# Patient Record
Sex: Female | Born: 1960
Health system: Southern US, Community
[De-identification: ages and names within clinical notes are randomized; demographics above are authoritative.]

## PROBLEM LIST (undated history)

## (undated) DIAGNOSIS — B029 Zoster without complications: Secondary | ICD-10-CM

## (undated) DIAGNOSIS — N390 Urinary tract infection, site not specified: Secondary | ICD-10-CM

## (undated) DIAGNOSIS — Z8619 Personal history of other infectious and parasitic diseases: Secondary | ICD-10-CM

## (undated) DIAGNOSIS — K635 Polyp of colon: Secondary | ICD-10-CM

## (undated) DIAGNOSIS — E079 Disorder of thyroid, unspecified: Secondary | ICD-10-CM

## (undated) DIAGNOSIS — B977 Papillomavirus as the cause of diseases classified elsewhere: Secondary | ICD-10-CM

## (undated) HISTORY — DX: Zoster without complications: B02.9

## (undated) HISTORY — DX: Disorder of thyroid, unspecified: E07.9

## (undated) HISTORY — PX: WISDOM TOOTH EXTRACTION: SHX21

## (undated) HISTORY — DX: Papillomavirus as the cause of diseases classified elsewhere: B97.7

## (undated) HISTORY — DX: Urinary tract infection, site not specified: N39.0

## (undated) HISTORY — PX: CHOLECYSTECTOMY: SHX55

## (undated) HISTORY — DX: Personal history of other infectious and parasitic diseases: Z86.19

## (undated) HISTORY — DX: Polyp of colon: K63.5

---

## 2012-10-31 LAB — HM COLONOSCOPY: HM Colonoscopy: ABNORMAL — AB

## 2013-01-01 LAB — HM COLONOSCOPY

## 2015-06-24 ENCOUNTER — Telehealth: Payer: Self-pay

## 2015-06-24 NOTE — Telephone Encounter (Signed)
Pre visit call completed 

## 2015-06-26 ENCOUNTER — Ambulatory Visit (INDEPENDENT_AMBULATORY_CARE_PROVIDER_SITE_OTHER): Payer: Self-pay | Admitting: Physician Assistant

## 2015-06-26 ENCOUNTER — Encounter: Payer: Self-pay | Admitting: Physician Assistant

## 2015-06-26 VITALS — BP 107/69 | HR 85 | Temp 98.2°F | Resp 16 | Ht 64.5 in | Wt 139.1 lb

## 2015-06-26 DIAGNOSIS — Z8619 Personal history of other infectious and parasitic diseases: Secondary | ICD-10-CM

## 2015-06-26 DIAGNOSIS — G629 Polyneuropathy, unspecified: Secondary | ICD-10-CM

## 2015-06-26 NOTE — Patient Instructions (Signed)
You will be contacted by Neurology for assessment. We will follow-up once you have seen the specialist.  Read options for smoking cessation below. Let me know what options you would like to try.  Smoking Cessation Quitting smoking is important to your health and has many advantages. However, it is not always easy to quit since nicotine is a very addictive drug. Oftentimes, people try 3 times or more before being able to quit. This document explains the best ways for you to prepare to quit smoking. Quitting takes hard work and a lot of effort, but you can do it. ADVANTAGES OF QUITTING SMOKING  You will live longer, feel better, and live better.  Your body will feel the impact of quitting smoking almost immediately.  Within 20 minutes, blood pressure decreases. Your pulse returns to its normal level.  After 8 hours, carbon monoxide levels in the blood return to normal. Your oxygen level increases.  After 24 hours, the chance of having a heart attack starts to decrease. Your breath, hair, and body stop smelling like smoke.  After 48 hours, damaged nerve endings begin to recover. Your sense of taste and smell improve.  After 72 hours, the body is virtually free of nicotine. Your bronchial tubes relax and breathing becomes easier.  After 2 to 12 weeks, lungs can hold more air. Exercise becomes easier and circulation improves.  The risk of having a heart attack, stroke, cancer, or lung disease is greatly reduced.  After 1 year, the risk of coronary heart disease is cut in half.  After 5 years, the risk of stroke falls to the same as a nonsmoker.  After 10 years, the risk of lung cancer is cut in half and the risk of other cancers decreases significantly.  After 15 years, the risk of coronary heart disease drops, usually to the level of a nonsmoker.  If you are pregnant, quitting smoking will improve your chances of having a healthy baby.  The people you live with, especially any  children, will be healthier.  You will have extra money to spend on things other than cigarettes. QUESTIONS TO THINK ABOUT BEFORE ATTEMPTING TO QUIT You may want to talk about your answers with your health care provider.  Why do you want to quit?  If you tried to quit in the past, what helped and what did not?  What will be the most difficult situations for you after you quit? How will you plan to handle them?  Who can help you through the tough times? Your family? Friends? A health care provider?  What pleasures do you get from smoking? What ways can you still get pleasure if you quit? Here are some questions to ask your health care provider:  How can you help me to be successful at quitting?  What medicine do you think would be best for me and how should I take it?  What should I do if I need more help?  What is smoking withdrawal like? How can I get information on withdrawal? GET READY  Set a quit date.  Change your environment by getting rid of all cigarettes, ashtrays, matches, and lighters in your home, car, or work. Do not let people smoke in your home.  Review your past attempts to quit. Think about what worked and what did not. GET SUPPORT AND ENCOURAGEMENT You have a better chance of being successful if you have help. You can get support in many ways.  Tell your family, friends, and coworkers that  you are going to quit and need their support. Ask them not to smoke around you.  Get individual, group, or telephone counseling and support. Programs are available at [employer] and health centers. Call your local health department for information about programs in your area.  Spiritual beliefs and practices may help some smokers quit.  Download a "quit meter" on your computer to keep track of quit statistics, such as how long you have gone without smoking, cigarettes not smoked, and money saved.  Get a self-help book about quitting smoking and staying off  tobacco. LEARN NEW SKILLS AND BEHAVIORS  Distract yourself from urges to smoke. Talk to someone, go for a walk, or occupy your time with a task.  Change your normal routine. Take a different route to work. Drink tea instead of coffee. Eat breakfast in a different place.  Reduce your stress. Take a hot bath, exercise, or read a book.  Plan something enjoyable to do every day. Reward yourself for not smoking.  Explore interactive web-based programs that specialize in helping you quit. GET MEDICINE AND USE IT CORRECTLY Medicines can help you stop smoking and decrease the urge to smoke. Combining medicine with the above behavioral methods and support can greatly increase your chances of successfully quitting smoking.  Nicotine replacement therapy helps deliver nicotine to your body without the negative effects and risks of smoking. Nicotine replacement therapy includes nicotine gum, lozenges, inhalers, nasal sprays, and skin patches. Some may be available over-the-counter and others require a prescription.  Antidepressant medicine helps people abstain from smoking, but how this works is unknown. This medicine is available by prescription.  Nicotinic receptor partial agonist medicine simulates the effect of nicotine in your brain. This medicine is available by prescription. Ask your health care provider for advice about which medicines to use and how to use them based on your health history. Your health care provider will tell you what side effects to look out for if you choose to be on a medicine or therapy. Carefully read the information on the package. Do not use any other product containing nicotine while using a nicotine replacement product.  RELAPSE OR DIFFICULT SITUATIONS Most relapses occur within the first 3 months after quitting. Do not be discouraged if you start smoking again. Remember, most people try several times before finally quitting. You may have symptoms of withdrawal because  your body is used to nicotine. You may crave cigarettes, be irritable, feel very hungry, cough often, get headaches, or have difficulty concentrating. The withdrawal symptoms are only temporary. They are strongest when you first quit, but they will go away within 10-14 days. To reduce the chances of relapse, try to:  Avoid drinking alcohol. Drinking lowers your chances of successfully quitting.  Reduce the amount of caffeine you consume. Once you quit smoking, the amount of caffeine in your body increases and can give you symptoms, such as a rapid heartbeat, sweating, and anxiety.  Avoid smokers because they can make you want to smoke.  Do not let weight gain distract you. Many smokers will gain weight when they quit, usually less than 10 pounds. Eat a healthy diet and stay active. You can always lose the weight gained after you quit.  Find ways to improve your mood other than smoking. FOR MORE INFORMATION  www.smokefree.gov  Document Released: 10/11/2001 Document Revised: 03/03/2014 Document Reviewed: 01/26/2012 [facility] Patient Information 2015 [place], [place]. This information is not intended to replace advice given to you by your health care provider.  Make sure you discuss any questions you have with your health care provider.  

## 2015-06-26 NOTE — Progress Notes (Signed)
Patient presents to clinic today to establish care.  Acute Concerns: Patient with history of herniated discs s/p surgical intervention. Endorses persistent idiopathic neuropathy since that time. Was seen by Neurology in [place] before just moving here. Is requesting referral to Neurology in the area.  Chronic Issues: Patient with recurrent shingles outbreaks, last about 3 months ago. Endorses prior immunocompetency testing without abnormal/worrisome findings. Is asking about potentially having the shingles vaccination.  Past Medical History  Diagnosis Date  . Thyroid disease     Nodules 2014, benign  . History of chicken pox   . Shingles   . Colon polyps     Benign  . UTI (lower urinary tract infection)     Past Surgical History  Procedure Laterality Date  . Cholecystectomy      2012  . Cesarean section      1996  . Wisdom tooth extraction      Current Outpatient Prescriptions on File Prior to Visit  Medication Sig Dispense Refill  . cyclobenzaprine (FLEXERIL) 10 MG tablet Take 10 mg by mouth 3 (three) times daily as needed for muscle spasms.    . naproxen (NAPROSYN) 250 MG tablet Take by mouth 2 (two) times daily as needed.      No current facility-administered medications on file prior to visit.    No Known Allergies  Family History  Problem Relation Age of Onset  . Lung cancer Mother 26    Deceased  . Prostate cancer Father 66    Deceased  . Pancreatic cancer Paternal Uncle   . Healthy Brother     x3  . Healthy Sister     x2  . Menstrual problems Daughter     x1    Social History   Social History  . Marital Status: Married    Spouse Name: N/A  . Number of Children: 1  . Years of Education: N/A   Occupational History  . Not on file.   Social History Main Topics  . Smoking status: Current Every Day Smoker -- 0.50 packs/day for 25 years  . Smokeless tobacco: Never Used  . Alcohol Use: 1.2 - 1.8 oz/week    2-3 Standard drinks or equivalent per  week     Comment: social  . Drug Use: No  . Sexual Activity: Not Currently   Other Topics Concern  . Not on file   Social History Narrative   Review of Systems  Constitutional: Negative for fever, weight loss and malaise/fatigue.  Neurological: Positive for tingling and sensory change. Negative for dizziness and loss of consciousness.    BP 107/69 mmHg  Pulse 85  Temp(Src) 98.2 F (36.8 C) (Oral)  Resp 16  Ht 5' 4.5" (1.638 m)  Wt 139 lb 2 oz (63.107 kg)  BMI 23.52 kg/m2  SpO2 100%  Physical Exam  Constitutional: She is oriented to person, place, and time and well-developed, well-nourished, and in no distress.  HENT:  Head: Normocephalic and atraumatic.  Cardiovascular: Normal rate, regular rhythm, normal heart sounds and intact distal pulses.   Pulmonary/Chest: Effort normal and breath sounds normal. No respiratory distress. She has no wheezes. She has no rales. She exhibits no tenderness.  Neurological: She is alert and oriented to person, place, and time.  Skin: Skin is warm and dry. No rash noted.  Psychiatric: Affect normal.  Vitals reviewed.   No results found for this or any previous visit (from the past 2160 hour(s)).  Assessment/Plan: Neuropathy Unspecified. Patient not wanting further  PCP workup. Is requesting referral to Neurology. Referral placed. Patient is current smoker. Advised her that smoking intensifies neuropathy symptoms. Encouraged to quit. Patient given handout on cessation options besides Chantix as she has had bad reaction previously. Will call after reviewing so treatment can be started.  History of shingles Discussed vaccination is not a guarantee there will be no more outbreaks. Will need to be outbreak free x 6 months before Zostavax administration. Also recommend ID referral.

## 2015-06-28 DIAGNOSIS — Z8619 Personal history of other infectious and parasitic diseases: Secondary | ICD-10-CM | POA: Insufficient documentation

## 2015-06-28 DIAGNOSIS — G629 Polyneuropathy, unspecified: Secondary | ICD-10-CM | POA: Insufficient documentation

## 2015-06-28 NOTE — Assessment & Plan Note (Signed)
Discussed vaccination is not a guarantee there will be no more outbreaks. Will need to be outbreak free x 6 months before Zostavax administration. Also recommend ID referral.

## 2015-06-28 NOTE — Assessment & Plan Note (Signed)
Unspecified. Patient not wanting further PCP workup. Is requesting referral to Neurology. Referral placed. Patient is current smoker. Advised her that smoking intensifies neuropathy symptoms. Encouraged to quit. Patient given handout on cessation options besides Chantix as she has had bad reaction previously. Will call after reviewing so treatment can be started.

## 2015-07-22 ENCOUNTER — Encounter: Payer: Self-pay | Admitting: Neurology

## 2015-07-22 ENCOUNTER — Ambulatory Visit (INDEPENDENT_AMBULATORY_CARE_PROVIDER_SITE_OTHER): Payer: Self-pay | Admitting: Neurology

## 2015-07-22 VITALS — BP 130/84 | HR 113 | Ht 64.5 in | Wt 138.6 lb

## 2015-07-22 DIAGNOSIS — M792 Neuralgia and neuritis, unspecified: Secondary | ICD-10-CM

## 2015-07-22 DIAGNOSIS — G379 Demyelinating disease of central nervous system, unspecified: Secondary | ICD-10-CM

## 2015-07-22 DIAGNOSIS — G373 Acute transverse myelitis in demyelinating disease of central nervous system: Secondary | ICD-10-CM

## 2015-07-22 DIAGNOSIS — M62838 Other muscle spasm: Secondary | ICD-10-CM

## 2015-07-22 DIAGNOSIS — G0489 Other myelitis: Secondary | ICD-10-CM

## 2015-07-22 DIAGNOSIS — M6249 Contracture of muscle, multiple sites: Secondary | ICD-10-CM

## 2015-07-22 MED ORDER — GABAPENTIN 300 MG PO CAPS: 300.0000 mg | ORAL_CAPSULE | Freq: Every day | ORAL | Status: DC

## 2015-07-22 MED ORDER — BACLOFEN 10 MG PO TABS: 5.0000 mg | ORAL_TABLET | Freq: Two times a day (BID) | ORAL | Status: DC

## 2015-07-22 NOTE — Progress Notes (Signed)
[facility] Neu[facility] Clinic Note - Initial Visit   Date: 07/23/2015  Regina Griffin MRN: [id number] DOB: 16-May-1961   Dear Malva Cogan:  Thank you for your kind referral of Regina Griffin for consultation of thoracic myelopathy. Although her history is well known to you, please allow [place] to reiterate it for the purpose of our medical record. The patient was accompanied to the clinic by self.    History of Present Illness: Regina Griffin is a 54 y.o. right-handed Caucasian female with chronic back pain and tobacco user presenting for evaluation of thoracic cord lesion with myelopathy.    She has been living in [place], [place] since December 2014 and moved to [place], [place] in July 2016.  She is here to establish care locally.  Starting in February 2015, she starting having bilateral leg cramps and thought it was due her low back.  In Janaury 2016, she developed tingling and numbness of the left foot, which again she attributed to her low back.  Over two weeks, tingling started involving her thigh, abdomen, and up to her rib cage.  She did accupuncture which helped her weakness and pain, but did not change her tingling.  In March, she woke up with right foot numbness and over two days, progressed to involve her entire right side to her ribs. Eventually, she was unable to even stand due to weakness and she lost function of her right leg.  She was admitted to hospital where imaging of her thoracic spine who showed new upper and mid thoracic T2 hyperintensity.  MRI brain was normal. CSF testing showed positive oligoclonal bands.  NMO antibody was negative.   She completed methlyprednisolone 1g for 5 days followed by oral prednisone taper which improved her motor function where she was discharged with a walker.   She was using a walker until April.  She had continued hypersensitivity of the legs.    She was being followed by Dr. Kelli Hope, neurologist in [place], [place] and  last evaluated in May 2016 at which time it was discussed whether this episode was an isolated event or the first presentation of multiple sclerosis and whether to start disease modifying therapy.  Because she was clinically improving, the decision for expectant approach was made and she was told to establish care once she moved to [facility].  Currently, she continues to have tingling and hypersensitivity over rib cage down to her feet.  She feels that her right leg strength has completely returned and denies any new symptoms.  She has occasional cramps of the legs.  She has not had any recent falls and is ambulating independently.   No prior history of vision loss or weakness. She reports having right hand paresthesias in 2000s, which was attributed to her neck disc herniation.   She has a sister with optic neuritis and is wheelchair bound because of disc herniation.     Out-side paper records, electronic medical record, and images have been reviewed where available and summarized as:  MRI lumbar spine 12/12/2014: 1.  No etiology for left L1 nerve root symptoms.  Small discs in the thoracic spine at T8-T9 and T10-T11. 2.  There is multilevel degenerative disc disease in the lower lumbar spine.  On the left side the worse level is L5-S1 with narrowing of the left lateral recess and contact on the descending left S1 nerve root. 3.  On the right side, there is stenosis of the right lateral recess at L4-L5 with probable impingement of the  descending right L5 nerve root. 4.  Mild canal stenosis at L3-L4 and L4-L5.  MRI brain 01/22/2015:  Normal study.  No evidence of demyelination.   MRI cervical spine 01/22/2015:   Multilevel degenerative spondylosis of the cervical spine.  Posterior disc bulges at all levels, most marked at C5-6 and C6-7 where there is tiny focus of cord signal change at C5-6.  Multilevel neural foraminal narrowing.    Labs 01/22/2015:  CRP 1.1, TSH 0.56, CK 187  Past Medical History    Diagnosis Date  . Thyroid disease     Nodules 2014, benign  . History of chicken pox   . Shingles   . Colon polyps     Benign  . UTI (lower urinary tract infection)     Past Surgical History  Procedure Laterality Date  . Cholecystectomy      2012  . Cesarean section      1996  . Wisdom tooth extraction       Medications:  Outpatient Encounter Prescriptions as of 07/22/2015  Medication Sig  . Cholecalciferol (VITAMIN D3) 5000 UNITS CAPS Take 5,000 Units by mouth daily.  . cyclobenzaprine (FLEXERIL) 10 MG tablet Take 10 mg by mouth 3 (three) times daily as needed for muscle spasms.  Marland Kitchen HYDROcodone-acetaminophen (NORCO) 7.5-325 MG per tablet Take 1 tablet by mouth every 6 (six) hours as needed for moderate pain.  . Multiple Vitamin (MULTIVITAMIN) tablet Take 1 tablet by mouth daily.  . naproxen (NAPROSYN) 250 MG tablet Take by mouth 2 (two) times daily as needed.   . baclofen (LIORESAL) 10 MG tablet Take 0.5 tablets (5 mg total) by mouth 2 (two) times daily.  Marland Kitchen gabapentin (NEURONTIN) 300 MG capsule Take 1 capsule (300 mg total) by mouth at bedtime.   No facility-administered encounter medications on file as of 07/22/2015.     Allergies: No Known Allergies  Family History: Family History  Problem Relation Age of Onset  . Lung cancer Mother 32    Deceased  . Prostate cancer Father 66    Deceased  . Pancreatic cancer Paternal Uncle   . Healthy Brother     x3  . Healthy Sister     x2  . Menstrual problems Daughter     x1    Social History: Social History  Substance Use Topics  . Smoking status: Current Every Day Smoker -- 0.50 packs/day for 25 years  . Smokeless tobacco: Never Used  . Alcohol Use: 1.2 - 1.8 oz/week    2-3 Standard drinks or equivalent per week     Comment: social   Social History   Social History Narrative   Lives with husband in a 2 story home.  Has one daughter. Lives in [place] but is moving back to the [place]  Self employed.  Education:  associates degree.    Review of Systems:  CONSTITUTIONAL: No fevers, chills, night sweats, or weight loss.   EYES: No visual changes or eye pain ENT: No hearing changes.  No history of nose bleeds.   RESPIRATORY: No cough, wheezing and shortness of breath.   CARDIOVASCULAR: Negative for chest pain, and palpitations.   GI: Negative for abdominal discomfort, blood in stools or black stools.  No recent change in bowel habits.   GU:  No history of incontinence.   MUSCLOSKELETAL: No history of joint pain or swelling.  No myalgias.   SKIN: Negative for lesions, rash, and itching.   HEMATOLOGY/ONCOLOGY: Negative for prolonged bleeding, bruising easily, and swollen nodes.  No history of cancer.   ENDOCRINE: Negative for cold or heat intolerance, polydipsia or goiter.   PSYCH:  No depression or anxiety symptoms.   NEURO: As Above.   Vital Signs:  BP 130/84 mmHg  Pulse 113  Ht 5' 4.5" (1.638 m)  Wt 138 lb 9 oz (62.852 kg)  BMI 23.43 kg/m2  SpO2 99%   General Medical Exam:   General:  Well appearing, comfortable.   Eyes/ENT: see cranial nerve examination.   Neck: No masses appreciated.  Full range of motion without tenderness.  No carotid bruits. Respiratory:  Clear to auscultation, good air entry bilaterally.   Cardiac:  Regular rate and rhythm, no murmur.   Extremities:  No deformities, edema, or skin discoloration.  Skin:  No rashes or lesions.  Neurological Exam: MENTAL STATUS including orientation to time, place, person, recent and remote memory, attention span and concentration, language, and fund of knowledge is normal.  Speech is not dysarthric.  CRANIAL NERVES: II:  No visual field defects.  Unremarkable fundi.   III-IV-VI: Pupils equal round and reactive to light.  Normal conjugate, extra-ocular eye movements in all directions of gaze.  No nystagmus.  No ptosis.   V:  Normal facial sensation.     VII:  Normal facial symmetry and movements.  No pathologic facial reflexes.    VIII:  Normal hearing and vestibular function.   IX-X:  Normal palatal movement.   XI:  Normal shoulder shrug and head rotation.   XII:  Normal tongue strength and range of motion, no deviation or fasciculation.  MOTOR:  No atrophy, fasciculations or abnormal movements.  No pronator drift.  Tone is normal.    Right Upper Extremity:    Left Upper Extremity:    Deltoid  5/5   Deltoid  5/5   Biceps  5/5   Biceps  5/5   Triceps  5/5   Triceps  5/5   Wrist extensors  5/5   Wrist extensors  5/5   Wrist flexors  5/5   Wrist flexors  5/5   Finger extensors  5/5   Finger extensors  5/5   Finger flexors  5/5   Finger flexors  5/5   Dorsal interossei  5/5   Dorsal interossei  5/5   Abductor pollicis  5/5   Abductor pollicis  5/5   Tone (Ashworth scale)  0  Tone (Ashworth scale)  0   Right Lower Extremity:    Left Lower Extremity:    Hip flexors  5/5   Hip flexors  5/5   Hip extensors  5/5   Hip extensors  5/5   Knee flexors  5/5   Knee flexors  5/5   Knee extensors  5/5   Knee extensors  5/5   Dorsiflexors  5/5   Dorsiflexors  5/5   Plantarflexors  5/5   Plantarflexors  5/5   Toe extensors  5/5   Toe extensors  5/5   Toe flexors  5/5   Toe flexors  5/5   Tone (Ashworth scale)  1  Tone (Ashworth scale)  1   MSRs:  Right  Left brachioradialis 2+  brachioradialis 2+  biceps 2+  biceps 2+  triceps 2+  triceps 2+  patellar 3+  patellar 3+  ankle jerk 2+  ankle jerk 2+  Hoffman no  Hoffman no  plantar response down  plantar response up   SENSORY:  Hyperesthesias to pin prick and temperature with sensory level at T9 bilaterally.  Vibration intact.  Romberg's sign absent.   COORDINATION/GAIT: Normal finger-to- nose-finger and heel-to-shin.  Intact rapid alternating movements bilaterally.  Able to rise from a chair without using arms.  Gait narrow based and stable, subtle spastic quality. Tandem and stressed gait intact.     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  Her symptoms have vastly improved after receiving a course of methylprednisolone and she no longer has leg weakness, but continues to have hyperesthesia involving her mid back down.  I have personally reviewed her images which shows short segment of T2 hyperintensity over ~ T3 and T8 levels.    I agree with her previous neurologist that this most likely represents clinically isolated syndrome and does not warrant disease modifying therapy at this time.  If she were to develop any new lesions on her MRI or clinical symptoms, low threshold to start immunomodulatory therapy.  I will obtain surveillance imaging, but we can do this once her insurance has started here, unless there is a change in her clinical presentation.  PLAN/RECOMMENDATIONS:  1.  MRI brain, cervical spine, and thoracic spine wwo contrast to be performed once patient has insurance.  She will call and let us know once her insurance paperwork has been approved 2.  Start gabapentin  at bedtime for paresthesias 3.  Start baclofen  twice daily for muscle spasticity  4.  Start vitamin D 4000 units daily  Return to clinic in 3 months, or sooner as needed   The duration of this appointment visit was 60 minutes of face-to-face time with the patient.  Greater than 50% of this time was spent in counseling, explanation of diagnosis, planning of further management, and coordination of care.   Thank you for allowing me to participate in patient's care.  If I can answer any additional questions, I would be pleased to do so.    Sincerely,    [name], DO

## 2015-07-22 NOTE — Patient Instructions (Signed)
1.  Please find out about your insurance coverage so we can order imaging as needed 2.  Start gabapentin 300mg  at bedtime 3.  Start baclofen 5mg  (half tablet) twice daily  4.  Start vitamin D 4000 units daily 5.  Return to clinic in 3-6 months

## 2015-07-23 DIAGNOSIS — G373 Acute transverse myelitis in demyelinating disease of central nervous system: Secondary | ICD-10-CM | POA: Insufficient documentation

## 2015-07-23 DIAGNOSIS — G35 Multiple sclerosis: Secondary | ICD-10-CM | POA: Insufficient documentation

## 2015-08-07 ENCOUNTER — Telehealth: Payer: Self-pay | Admitting: Physician Assistant

## 2015-08-07 ENCOUNTER — Telehealth: Payer: Self-pay | Admitting: Neurology

## 2015-08-07 MED ORDER — BUPROPION HCL ER (SR) 150 MG PO TB12: 150.0000 mg | ORAL_TABLET | Freq: Two times a day (BID) | ORAL | Status: DC

## 2015-08-07 NOTE — Telephone Encounter (Signed)
Patient is taking baclofen and gabapentin but she doesn't think they are doing anything for her.  Can she stop taking them?

## 2015-08-07 NOTE — Telephone Encounter (Signed)
Caller name: Gracey Deprimo  Relationship to patient: Self   Can be reached: [phone]   Pharmacy: [id number] - [place],  - [address]  Reason for call: pt says that per her last visit with you you wanted to wait until her appt with the neurologist to see if it is okay to start her on Welbutrin. She says that she was informed by Dr. Allena Katz that Welbutrin is perfectly fine to start to help pt stop smoking. Pt want to know if she will have to schedule to see you again or if you can just call in the Rx for her? Please advise.

## 2015-08-07 NOTE — Telephone Encounter (Signed)
OK to stop.

## 2015-08-07 NOTE — Telephone Encounter (Signed)
Patient notified

## 2015-08-07 NOTE — Telephone Encounter (Signed)
Patient informed, understood & agreed; F/U appt scheduled Wed, 09/02/15 at 10:30a/SLS

## 2015-08-07 NOTE — Telephone Encounter (Signed)
PT would like a call back in regards to her medication/Dawn CB# [phone]

## 2015-08-07 NOTE — Telephone Encounter (Signed)
OK to send in Rx for Wellbutrin 150 mg SR to take BID. Send in 60 tablets with 1 refill. Have her follow-up 1 month.

## 2015-08-26 ENCOUNTER — Encounter: Payer: Self-pay | Admitting: Neurology

## 2015-09-02 ENCOUNTER — Encounter (INDEPENDENT_AMBULATORY_CARE_PROVIDER_SITE_OTHER): Payer: Self-pay

## 2015-09-02 ENCOUNTER — Ambulatory Visit (INDEPENDENT_AMBULATORY_CARE_PROVIDER_SITE_OTHER): Payer: Self-pay | Admitting: Physician Assistant

## 2015-09-02 ENCOUNTER — Encounter: Payer: Self-pay | Admitting: Physician Assistant

## 2015-09-02 ENCOUNTER — Other Ambulatory Visit: Payer: Self-pay | Admitting: Physician Assistant

## 2015-09-02 VITALS — BP 122/86 | HR 93 | Temp 98.1°F | Resp 16 | Ht 65.0 in | Wt 138.5 lb

## 2015-09-02 DIAGNOSIS — G629 Polyneuropathy, unspecified: Secondary | ICD-10-CM

## 2015-09-02 DIAGNOSIS — Z720 Tobacco use: Secondary | ICD-10-CM

## 2015-09-02 DIAGNOSIS — Z87891 Personal history of nicotine dependence: Secondary | ICD-10-CM | POA: Insufficient documentation

## 2015-09-02 MED ORDER — CYCLOBENZAPRINE HCL 10 MG PO TABS: 10.0000 mg | ORAL_TABLET | Freq: Three times a day (TID) | ORAL | Status: DC | PRN

## 2015-09-02 NOTE — Assessment & Plan Note (Signed)
Doing well. Patient commended on improvements made. Will continue Wellbutrin.

## 2015-09-02 NOTE — Progress Notes (Signed)
Pre visit review using our clinic review tool, if applicable. No additional management support is needed unless otherwise documented below in the visit note/SLS  

## 2015-09-02 NOTE — Progress Notes (Signed)
Patient presents to clinic today for follow-up of smoking cessation. Patient started on Wellbutrin SR 150 mg BID. Endorses taking as directed. Has noted improvement in smoking. Is down from 1 ppd to 3 cigarettes per day. Notes medication is working well. Patient has joined a gym to help with exercise and stress relief. Endorses stressors caused her to fall off the "wagon" yesterday but is back on track.   Past Medical History  Diagnosis Date  . Thyroid disease     Nodules 2014, benign  . History of chicken pox   . Shingles   . Colon polyps     Benign  . UTI (lower urinary tract infection)     Current Outpatient Prescriptions on File Prior to Visit  Medication Sig Dispense Refill  . buPROPion (WELLBUTRIN SR) 150 MG 12 hr tablet Take 1 tablet (150 mg total) by mouth 2 (two) times daily. 60 tablet 1  . Cholecalciferol (VITAMIN D3) 5000 UNITS CAPS Take 5,000 Units by mouth daily.    Marland Kitchen HYDROcodone-acetaminophen (NORCO) 7.5-325 MG per tablet Take 1 tablet by mouth every 6 (six) hours as needed for moderate pain.    . Multiple Vitamin (MULTIVITAMIN) tablet Take 1 tablet by mouth daily.    . naproxen (NAPROSYN) 250 MG tablet Take by mouth 2 (two) times daily as needed.      No current facility-administered medications on file prior to visit.    No Known Allergies  Family History  Problem Relation Age of Onset  . Lung cancer Mother 64    Deceased  . Prostate cancer Father 15    Deceased  . Pancreatic cancer Paternal Uncle   . Healthy Brother     x3  . Healthy Sister     x2  . Menstrual problems Daughter     x1    Social History   Social History  . Marital Status: Married    Spouse Name: N/A  . Number of Children: 1  . Years of Education: N/A   Social History Main Topics  . Smoking status: Current Every Day Smoker -- 0.50 packs/day for 25 years  . Smokeless tobacco: Never Used  . Alcohol Use: 1.2 - 1.8 oz/week    2-3 Standard drinks or equivalent per week   Comment: social  . Drug Use: No  . Sexual Activity: Not Currently   Other Topics Concern  . None   Social History Narrative   Lives with husband in a 2 story home.  Has one daughter. Lives in [place] but is moving back to the [place]  Self employed.  Education: associates degree.   Review of Systems - See HPI.  All other ROS are negative.  BP 122/86 mmHg  Pulse 93  Temp(Src) 98.1 F (36.7 C) (Oral)  Resp 16  Ht  (1.651 m)  Wt 138 lb 8 oz (62.823 kg)  BMI 23.05 kg/m2  SpO2 99%  Physical Exam  Constitutional: She is oriented to person, place, and time and well-developed, well-nourished, and in no distress.  HENT:  Head: Normocephalic and atraumatic.  Eyes: Conjunctivae are normal.  Cardiovascular: Normal rate, regular rhythm, normal heart sounds and intact distal pulses.   Pulmonary/Chest: Effort normal and breath sounds normal. No respiratory distress. She has no wheezes. She has no rales. She exhibits no tenderness.  Neurological: She is alert and oriented to person, place, and time.  Skin: Skin is warm and dry. No rash noted.  Psychiatric: Affect normal.  Vitals reviewed.  No  results found for this or any previous visit (from the past 2160 hour(s)).  Assessment/Plan: Tobacco abuse disorder Doing well. Patient commended on improvements made. Will continue Wellbutrin.

## 2015-09-02 NOTE — Patient Instructions (Signed)
Please continue the Wellbutrin as directed. You have additional refills on the current prescription. Follow-up with me in a few months once insurance has kicked in! Congratulations on success so far!  Please go to the lab. I will call with results.

## 2015-09-03 LAB — LYME AB/WESTERN BLOT REFLEX: B BURGDORFERI AB IGG+ IGM: 0.19 {ISR}

## 2015-10-05 ENCOUNTER — Encounter: Payer: Self-pay | Admitting: Physician Assistant

## 2015-10-05 MED ORDER — BUPROPION HCL ER (SR) 150 MG PO TB12: 150.0000 mg | ORAL_TABLET | Freq: Two times a day (BID) | ORAL | Status: DC

## 2015-10-07 ENCOUNTER — Other Ambulatory Visit: Payer: Self-pay | Admitting: Physician Assistant

## 2015-11-25 ENCOUNTER — Encounter: Payer: Self-pay | Admitting: Neurology

## 2015-11-25 ENCOUNTER — Ambulatory Visit (INDEPENDENT_AMBULATORY_CARE_PROVIDER_SITE_OTHER): Payer: Self-pay | Admitting: Neurology

## 2015-11-25 VITALS — BP 100/70 | HR 91 | Wt 141.3 lb

## 2015-11-25 DIAGNOSIS — M6249 Contracture of muscle, multiple sites: Secondary | ICD-10-CM

## 2015-11-25 DIAGNOSIS — M792 Neuralgia and neuritis, unspecified: Secondary | ICD-10-CM

## 2015-11-25 DIAGNOSIS — G379 Demyelinating disease of central nervous system, unspecified: Secondary | ICD-10-CM

## 2015-11-25 DIAGNOSIS — M62838 Other muscle spasm: Secondary | ICD-10-CM

## 2015-11-25 MED ORDER — NORTRIPTYLINE HCL 10 MG PO CAPS: ORAL_CAPSULE | ORAL | Status: DC

## 2015-11-25 MED ORDER — CYCLOBENZAPRINE HCL 10 MG PO TABS: 10.0000 mg | ORAL_TABLET | Freq: Every day | ORAL | Status: DC

## 2015-11-25 NOTE — Patient Instructions (Addendum)
1.  Start nortriptyline 10mg  at bedtime for two weeks, then increase to 20mg  (2 tab) at bedtime. 2.  Continue flexeril 10mg  at bedtime 3.  Please call with your insurance information so we can schedule imaging 4.  Return to clinic 5-6 months

## 2015-11-25 NOTE — Progress Notes (Signed)
Follow-up Visit   Date: 11/25/2015    Regina Griffin MRN: [id number] DOB: 05-May-1961   Interim History: Regina Griffin is a 55 y.o. 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  The patient was accompanied to the clinic by self.  History of present illness: She has been living in [place], [place] since December 2014 and moved to [place], [place] in July 2016.   Starting in February 2015, she starting having bilateral leg cramps and thought it was due her low back. In Janaury 2016, she developed tingling and numbness of the left foot, which again she attributed to her low back. Over two weeks, tingling started involving her thigh, abdomen, and up to her rib cage. She did accupuncture which helped her weakness and pain, but did not change her tingling. In March, she woke up with right foot numbness and over two days, progressed to involve her entire right side to her ribs. Eventually, she was unable to even stand due to weakness and she lost function of her right leg. She was admitted to hospital where imaging of her thoracic spine who showed new upper and mid thoracic T2 hyperintensity. MRI brain was normal. CSF testing showed positive oligoclonal bands. NMO antibody was negative. She completed methlyprednisolone 1g for 5 days followed by oral prednisone taper which improved her motor function where she was discharged with a walker. She was using a walker until April. She had continued hypersensitivity of the legs.   She was being followed by Dr. Kelli Hope, neurologist in [place], [place] and last evaluated in May 2016 at which time it was discussed whether this episode was an isolated event or the first presentation of multiple sclerosis and whether to start disease modifying therapy. Because she was clinically improving, the decision for  expectant approach was made and she was told to establish care once she moved to [facility].  No prior history of vision loss or weakness. She reports having right hand paresthesias in 2000s, which was attributed to her neck disc herniation.   UPDATE 11/25/2015:  At her last visit, she was started on baclofen and gabapentin, but due to sleepiness she stopped this.  She continues to have tingling and hypersensitivity over rib cage down to her feet, which has not improve nor worsened from her last visit.   She no longer experiences any weakness of the legs and join [employer] and started stretching which helps.  She no longer has muscle cramps and feels that flexeril 10mg  at bedtime helps with this.  There has been no new symptoms.  She had a dilated eye exam and said that there was no changes of the optic nerve.  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     Medications:  Current Outpatient Prescriptions on File Prior to Visit  Medication Sig Dispense Refill  . buPROPion (WELLBUTRIN SR) 150 MG 12 hr tablet Take 1 tablet (150 mg total) by mouth 2 (two) times daily. 60 tablet 5  . Cholecalciferol (VITAMIN D3) 5000 UNITS CAPS Take 5,000 Units by mouth daily.    . cyclobenzaprine (FLEXERIL) 10 MG tablet Take 1 tablet (10 mg total) by mouth 3 (three) times daily as needed for muscle spasms. 90 tablet 0  . HYDROcodone-acetaminophen (NORCO) 7.5-325 MG per tablet Take 1 tablet by mouth every 6 (six) hours as needed for moderate pain.    Marland Kitchen  Multiple Vitamin (MULTIVITAMIN) tablet Take 1 tablet by mouth daily.    . naproxen (NAPROSYN) 250 MG tablet Take by mouth 2 (two) times daily as needed.      No current facility-administered medications on file prior to visit.    Allergies: No Known Allergies  Review of Systems:  CONSTITUTIONAL: No fevers, chills, night sweats, or weight loss.  EYES: No visual changes or eye pain ENT: No hearing changes.  No history of nose bleeds.     RESPIRATORY: No cough, wheezing and shortness of breath.   CARDIOVASCULAR: Negative for chest pain, and palpitations.   GI: Negative for abdominal discomfort, blood in stools or black stools.  No recent change in bowel habits.   GU:  No history of incontinence.   MUSCLOSKELETAL: No history of joint pain or swelling.  No myalgias.   SKIN: Negative for lesions, rash, and itching.   ENDOCRINE: Negative for cold or heat intolerance, polydipsia or goiter.   PSYCH:  No depression or anxiety symptoms.   NEURO: As Above.   Vital Signs:  BP 100/70 mmHg  Pulse 91  Wt 141 lb 5 oz (64.099 kg)  SpO2 98%  Neurological Exam: MENTAL STATUS including orientation to time, place, person, recent and remote memory, attention span and concentration, language, and fund of knowledge is normal.  Speech is not dysarthric.  CRANIAL NERVES: Fundoscopic exam is normal.  No visual field defects. Pupils equal round and reactive to light.  Normal conjugate, extra-ocular eye movements in all directions of gaze.  No ptosis. Normal facial sensation.  Face is symmetric. Palate elevates symmetrically.  Tongue is midline.  MOTOR:  Motor strength is 5/5 in all extremities.  No atrophy, fasciculations or abnormal movements.  No pronator drift.  Tone is normal in the upper extremities and 0+ in the lower extremities.     MSRs:  Right                                                                 Left brachioradialis 2+  brachioradialis 2+  biceps 2+  biceps 2+  triceps 2+  triceps 2+  patellar 3+  patellar 3+  ankle jerk 2+  ankle jerk 1+  Hoffman no  Hoffman no  plantar response down  plantar response down   SENSORY:  Hyperesthesias to light touch, vibration, and pin pick in the lower legs, no sensory level.  COORDINATION/GAIT:  Normal finger-to- nose-finger and heel-to-shin.  Intact rapid alternating movements bilaterally.  Gait narrow based and stable.  Stressed gait intact.   Data: MRI lumbar spine  12/12/2014: 1. No etiology for left L1 nerve root symptoms. Small discs in the thoracic spine at T8-T9 and T10-T11. 2. There is multilevel degenerative disc disease in the lower lumbar spine. On the left side the worse level is L5-S1 with narrowing of the left lateral recess and contact on the descending left S1 nerve root. 3. On the right side, there is stenosis of the right lateral recess at L4-L5 with probable impingement of the descending right L5 nerve root. 4. Mild canal stenosis at L3-L4 and L4-L5.  MRI brain 01/22/2015: Normal study. No evidence of demyelination.   MRI cervical spine 01/22/2015:  Multilevel degenerative spondylosis of the cervical spine. Posterior disc bulges at all levels,  most marked at C5-6 and C6-7 where there is tiny focus of cord signal change at C5-6. Multilevel neural foraminal narrowing.   Labs 01/22/2015: CRP 1.1, TSH 0.56, CK 187   IMPRESSION/PLAN: Mrs. 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 Her symptoms have vastly improved after receiving a course of methylprednisolone and she no longer has leg weakness, but continues to have hyperesthesia involving her mid back down.MRI shows short segment of T2 hyperintensity over ~ T3 and T8 levels.   I agree with her previous neurologist that this most likely represents clinically isolated syndrome and does not warrant disease modifying therapy at this time. If she were to develop any new lesions on her MRI or clinical symptoms, low threshold to start immunomodulatory therapy. She will need surveillance imaging.  In the meantime, symptoms management of her dysesthesias will be optimized.  She did not tolerate gabapentin, so will offer nortriptyline.  Flexeril is controlling her spasticity fairly well, which she prefers over baclofen.   PLAN/RECOMMENDATIONS:  1. MRI  brain, cervical spine, and thoracic spine wwo contrast to be performed once patient has insurance. She will call and let us know once her insurance paperwork has been approved 2. Start nortriptyline 10mg  at bedtime for two weeks, then increase to 20mg  at bedtime 3. Continue flexeril 10mg  at bedtime 4.  Continue vitamin D 4000 units daily  Return to clinic in 5-6 months  The duration of this appointment visit was 30 minutes of face-to-face time with the patient.  Greater than 50% of this time was spent in counseling, explanation of diagnosis, planning of further management, and coordination of care.   Thank you for allowing me to participate in patient's care.  If I can answer any additional questions, I would be pleased to do so.    Sincerely,    Aarica Wax K. Allena Katz, DO

## 2016-01-20 ENCOUNTER — Other Ambulatory Visit: Payer: Self-pay | Admitting: *Deleted

## 2016-01-20 DIAGNOSIS — M792 Neuralgia and neuritis, unspecified: Secondary | ICD-10-CM

## 2016-01-20 DIAGNOSIS — M62838 Other muscle spasm: Secondary | ICD-10-CM

## 2016-01-20 DIAGNOSIS — G379 Demyelinating disease of central nervous system, unspecified: Secondary | ICD-10-CM

## 2016-01-20 DIAGNOSIS — G373 Acute transverse myelitis in demyelinating disease of central nervous system: Secondary | ICD-10-CM

## 2016-01-29 ENCOUNTER — Ambulatory Visit
Admission: RE | Admit: 2016-01-29 | Discharge: 2016-01-29 | Disposition: A | Discharge status: Home / Self Care | Payer: BLUE CROSS/BLUE SHIELD | Source: Ambulatory Visit | Attending: Neurology | Admitting: Neurology

## 2016-01-29 DIAGNOSIS — G373 Acute transverse myelitis in demyelinating disease of central nervous system: Secondary | ICD-10-CM

## 2016-01-29 DIAGNOSIS — M792 Neuralgia and neuritis, unspecified: Secondary | ICD-10-CM

## 2016-01-29 DIAGNOSIS — G379 Demyelinating disease of central nervous system, unspecified: Secondary | ICD-10-CM

## 2016-01-29 DIAGNOSIS — M62838 Other muscle spasm: Secondary | ICD-10-CM

## 2016-01-29 MED ORDER — GADOBENATE DIMEGLUMINE 529 MG/ML IV SOLN
13.0000 mL | Freq: Once | INTRAVENOUS | Status: AC | PRN
  Administered 2016-01-29: 13 mL via INTRAVENOUS

## 2016-02-01 ENCOUNTER — Telehealth: Payer: Self-pay | Admitting: Neurology

## 2016-02-01 NOTE — Telephone Encounter (Signed)
Called and discussed results of MRI with patient.  There is a new T2 hyperintense lesion affecting the left temporal tip and gliosis seem at T2 (old); taken together these findings are suggestive of multiple sclerosis.  I will have her return to the office to discuss disease modifying therapies.   [name], DO

## 2016-03-21 ENCOUNTER — Telehealth: Payer: Self-pay | Admitting: Neurology

## 2016-03-21 NOTE — Telephone Encounter (Signed)
Regina Griffin 02/25/1961. Nicholos Johns from [facility] [phone] called needing a refill for Nortriptyline 90 day supply.  Thank You

## 2016-03-22 NOTE — Telephone Encounter (Signed)
Rx sent 

## 2016-04-25 ENCOUNTER — Ambulatory Visit (INDEPENDENT_AMBULATORY_CARE_PROVIDER_SITE_OTHER): Payer: BLUE CROSS/BLUE SHIELD | Admitting: Neurology

## 2016-04-25 ENCOUNTER — Encounter: Payer: Self-pay | Admitting: Neurology

## 2016-04-25 VITALS — BP 100/64 | HR 99 | Ht 65.0 in | Wt 142.6 lb

## 2016-04-25 DIAGNOSIS — G379 Demyelinating disease of central nervous system, unspecified: Secondary | ICD-10-CM | POA: Diagnosis not present

## 2016-04-25 DIAGNOSIS — M62838 Other muscle spasm: Secondary | ICD-10-CM

## 2016-04-25 DIAGNOSIS — M6249 Contracture of muscle, multiple sites: Secondary | ICD-10-CM

## 2016-04-25 MED ORDER — NORTRIPTYLINE HCL 10 MG PO CAPS: 20.0000 mg | ORAL_CAPSULE | Freq: Every day | ORAL | Status: DC

## 2016-04-25 NOTE — Patient Instructions (Addendum)
1.  Continue your medications as you are taking them 2.  Recommend annual eye exam  Return to clinic in November 2017

## 2016-04-25 NOTE — Progress Notes (Signed)
Follow-up Visit   Date: 04/25/2016    Amillya Chavira MRN: [id number] DOB: May 10, 1961   Interim History: Luellen Howson is a 55 y.o. 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  The patient was accompanied to the clinic by self.  History of present illness: She has been living in [place], [place] since December 2014 and moved to [place], [place] in July 2016.   Starting in February 2015, she starting having bilateral leg cramps and thought it was due her low back. In Janaury 2016, she developed tingling and numbness of the left foot, which again she attributed to her low back. Over two weeks, tingling started involving her thigh, abdomen, and up to her rib cage. She did accupuncture which helped her weakness and pain, but did not change her tingling. In March, she woke up with right foot numbness and over two days, progressed to involve her entire right side to her ribs. Eventually, she was unable to even stand due to weakness and she lost function of her right leg. She was admitted to hospital where imaging of her thoracic spine who showed new upper and mid thoracic T2 hyperintensity. MRI brain was normal. CSF testing showed positive oligoclonal bands. NMO antibody was negative. She completed methlyprednisolone 1g for 5 days followed by oral prednisone taper which improved her motor function where she was discharged with a walker. She was using a walker until April. She had continued hypersensitivity of the legs.   She was being followed by Dr. Kelli Hope, neurologist in [place], [place] and last evaluated in May 2016 at which time it was discussed whether this episode was an isolated event or the first presentation of multiple sclerosis and whether to start disease modifying therapy. Because she was clinically improving, the decision for  expectant approach was made and she was told to establish care once she moved to [facility].  No prior history of vision loss or weakness. She reports having right hand paresthesias in 2000s, which was attributed to her neck disc herniation.   UPDATE 11/25/2015:  At her last visit, she was started on baclofen and gabapentin, but due to sleepiness she stopped this.  She continues to have tingling and hypersensitivity over rib cage down to her feet, which has not improve nor worsened from her last visit.   She no longer experiences any weakness of the legs and join [employer] and started stretching which helps.  She no longer has muscle cramps and feels that flexeril  at bedtime helps with this.  There has been no new symptoms.  She had a dilated eye exam and said that there was no changes of the optic nerve.  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    UPDATE 04/25/2016:  No new complaints.  She is here to discuss surveillance imaging of the brain, cervical, and thoracic spine (see below). She did research medications used for MS and is concerned about the potential side effects.   She reports having frequent bouts of shingles which occurs every few months and is worried that because they are immunosuppressant medications, she will have worsening episodes of shingles.  Currently, she denies any vision changes, numbness/tingling, or weakness. Her legs continue to be achy at the end of the day.   Medications:  Current Outpatient Prescriptions on File Prior to Visit  Medication Sig Dispense  Refill  . buPROPion (WELLBUTRIN SR) 150 MG 12 hr tablet Take 1 tablet (150 mg total) by mouth 2 (two) times daily. 60 tablet 5  . Cholecalciferol (VITAMIN D3) 5000 UNITS CAPS Take 5,000 Units by mouth daily.    . cyclobenzaprine (FLEXERIL) 10 MG tablet Take 1 tablet (10 mg total) by mouth at bedtime. 30 tablet 5  . HYDROcodone-acetaminophen (NORCO) 7.5-325 MG per tablet Take 1 tablet by  mouth every 6 (six) hours as needed for moderate pain.    . Multiple Vitamin (MULTIVITAMIN) tablet Take 1 tablet by mouth daily.    . naproxen (NAPROSYN) 250 MG tablet Take by mouth 2 (two) times daily as needed.      No current facility-administered medications on file prior to visit.    Allergies: No Known Allergies  Review of Systems:  CONSTITUTIONAL: No fevers, chills, night sweats, or weight loss.  EYES: No visual changes or eye pain ENT: No hearing changes.  No history of nose bleeds.   RESPIRATORY: No cough, wheezing and shortness of breath.   CARDIOVASCULAR: Negative for chest pain, and palpitations.   GI: Negative for abdominal discomfort, blood in stools or black stools.  No recent change in bowel habits.   GU:  No history of incontinence.   MUSCLOSKELETAL: No history of joint pain or swelling.  No myalgias.   SKIN: Negative for lesions, rash, and itching.   ENDOCRINE: Negative for cold or heat intolerance, polydipsia or goiter.   PSYCH:  No depression or anxiety symptoms.   NEURO: As Above.   Vital Signs:  BP 100/64 mmHg  Pulse 99  Ht 5\' 5"  (1.651 m)  Wt 142 lb 9 oz (64.666 kg)  BMI 23.72 kg/m2  SpO2 97%  Neurological Exam: MENTAL STATUS including orientation to time, place, person, recent and remote memory, attention span and concentration, language, and fund of knowledge is normal.  Speech is not dysarthric.  CRANIAL NERVES: Fundoscopic exam is normal.  No visual field defects. Pupils equal round and reactive to light.  Normal conjugate, extra-ocular eye movements in all directions of gaze.  No ptosis. Normal facial sensation.  Face is symmetric. Palate elevates symmetrically.  Tongue is midline.  MOTOR:  Motor strength is 5/5 in all extremities.  No atrophy, fasciculations or abnormal movements.  No pronator drift.  Tone is normal in the upper extremities and 0+ in the lower extremities.     MSRs:  Right                                                                  Left brachioradialis 2+  brachioradialis 2+  biceps 2+  biceps 2+  triceps 2+  triceps 2+  patellar 3+  patellar 3+  ankle jerk 2+  ankle jerk 1+  Hoffman no  Hoffman no  plantar response down  plantar response down   SENSORY:  Hyperesthesias to light touch, vibration, and pin pick in the lower legs, no sensory level.  COORDINATION/GAIT:  Normal finger-to- nose-finger and heel-to-shin.  Intact rapid alternating movements bilaterally.  Gait narrow based and stable.  Stressed gait intact.   Data: MRI lumbar spine 12/12/2014: 1. No etiology for left L1 nerve root symptoms. Small discs in the thoracic spine at T8-T9 and T10-T11. 2. There is  multilevel degenerative disc disease in the lower lumbar spine. On the left side the worse level is L5-S1 with narrowing of the left lateral recess and contact on the descending left S1 nerve root. 3. On the right side, there is stenosis of the right lateral recess at L4-L5 with probable impingement of the descending right L5 nerve root. 4. Mild canal stenosis at L3-L4 and L4-L5.  MRI brain 01/22/2015: Normal study. No evidence of demyelination.   MRI cervical spine 01/22/2015:  Multilevel degenerative spondylosis of the cervical spine. Posterior disc bulges at all levels, most marked at C5-6 and C6-7 where there is tiny focus of cord signal change at C5-6. Multilevel neural foraminal narrowing.   MRI brain wwo contrast 01/29/2016:  Normal examination with the exception of a small focus of gliosis in the white matter of the left temporal tip. No cause of the presenting symptoms is identified.   MRI cervical and thoracic spine wwo contrast 01/29/2016: Abnormal T2 signal within the dorsal spinal cord at the T2 level. This could be due to demyelinating disease or myelitis. In correlation with the small ocus of abnormal white matter in the left temporal lobe, multiple sclerosis is possible.   Degenerative spondylosis from C2-3 through C6-7. No  cord compression. Foraminal encroachment by osteophytes. There would be some potential to affect the C7 nerve roots based on foraminal narrowing.   Degenerative disc disease throughout the thoracic region as outlined above. Most of the abnormalities do not appear to significantly encroach upon the neural spaces or seem likely to be symptomatic other than possible associated a shin with back pain. At T8-9, there is a larger central disc herniation that effaces the ventral subarachnoid space and contacts the ventral aspect of the cord which could be symptomatic.  Labs 01/22/2015: CRP 1.1, TSH 0.56, CK 187   IMPRESSION/PLAN: Mrs. Bissey is a 55 year-old female returning for evaluation of a paraparetic illness due to thoracic cord demyelination involving the upper and midthoracic level in March 2016. Her symptoms have vastly improved after receiving a course of methylprednisolone and she no longer has leg weakness, but continues to have hyperesthesia involving her mid back down.MRI shows short segment of T2 hyperintensity over ~ T3 and T8 levels.   She had surveillance imaging of her brain, cervical, and thoracic cord  March 2017 which shows a new left temporal tip hyperintensity. There was no active lesions.  With the presence of thoracic lesion and new silent left temporal lesion, the diagnosis of multiple sclerosis is most likely.  Disease modifying therapies were discussed, but since she has been clinically stable and she is apprehensive about potential side effects, including shingles, she does not wish to start anything at this time.    PLAN/RECOMMENDATIONS:  1. DMT declined at this time, I will follow her clinically and reimage in 6 months.  If there are any new interval changes, low threshold to reconsider DMTs 2. Continue nortriptyline 20mg  at bedtime  3. Continue flexeril 10mg  at bedtime 4.  Continue vitamin D 4000 units daily  Return to clinic in 5 months  The duration of this  appointment visit was 30 minutes of face-to-face time with the patient.  Greater than 50% of this time was spent in counseling, explanation of diagnosis, planning of further management, and coordination of care.   Thank you for allowing me to participate in patient's care.  If I can answer any additional questions, I would be pleased to do so.    Sincerely,  Devian Bartolomei K. Mairim Bade, DO    

## 2016-05-20 ENCOUNTER — Encounter: Payer: Self-pay | Admitting: Physician Assistant

## 2016-05-20 ENCOUNTER — Ambulatory Visit (INDEPENDENT_AMBULATORY_CARE_PROVIDER_SITE_OTHER): Payer: BLUE CROSS/BLUE SHIELD | Admitting: Physician Assistant

## 2016-05-20 VITALS — BP 113/60 | HR 88 | Temp 97.7°F | Resp 16 | Ht 65.0 in | Wt 141.5 lb

## 2016-05-20 DIAGNOSIS — Z0289 Encounter for other administrative examinations: Secondary | ICD-10-CM

## 2016-05-20 DIAGNOSIS — Z7689 Persons encountering health services in other specified circumstances: Secondary | ICD-10-CM | POA: Insufficient documentation

## 2016-05-20 DIAGNOSIS — Z1239 Encounter for other screening for malignant neoplasm of breast: Secondary | ICD-10-CM

## 2016-05-20 DIAGNOSIS — Z720 Tobacco use: Secondary | ICD-10-CM

## 2016-05-20 DIAGNOSIS — Z0000 Encounter for general adult medical examination without abnormal findings: Secondary | ICD-10-CM | POA: Insufficient documentation

## 2016-05-20 LAB — URINALYSIS, ROUTINE W REFLEX MICROSCOPIC
BILIRUBIN URINE: NEGATIVE
Hgb urine dipstick: NEGATIVE
Ketones, ur: NEGATIVE
LEUKOCYTES UA: NEGATIVE
NITRITE: NEGATIVE
RBC / HPF: NONE SEEN (ref 0–?)
Specific Gravity, Urine: <=1.005 — AB (ref 1.000–1.030)
TOTAL PROTEIN, URINE-UPE24: NEGATIVE
URINE GLUCOSE: NEGATIVE
Urobilinogen, UA: 0.2 (ref 0.0–1.0)
WBC, UA: NONE SEEN (ref 0–?)
pH: 6 (ref 5.0–8.0)

## 2016-05-20 LAB — COMPREHENSIVE METABOLIC PANEL
ALT: 17 U/L (ref 0–35)
AST: 18 U/L (ref 0–37)
Albumin: 4 g/dL (ref 3.5–5.2)
Alkaline Phosphatase: 54 U/L (ref 39–117)
BILIRUBIN TOTAL: 0.8 mg/dL (ref 0.2–1.2)
BUN: 11 mg/dL (ref 6–23)
CALCIUM: 9.4 mg/dL (ref 8.4–10.5)
CHLORIDE: 111 meq/L (ref 96–112)
CO2: 28 meq/L (ref 19–32)
CREATININE: 0.84 mg/dL (ref 0.40–1.20)
GFR: 75.18 mL/min (ref >60.00)
GLUCOSE: 90 mg/dL (ref 70–99)
Potassium: 4.4 mEq/L (ref 3.5–5.1)
SODIUM: 143 meq/L (ref 135–145)
Total Protein: 6.6 g/dL (ref 6.0–8.3)

## 2016-05-20 LAB — CBC WITH DIFFERENTIAL/PLATELET
BASOS ABS: 0.1 10*3/uL (ref 0.0–0.1)
BASOS PCT: 0.8 % (ref 0.0–3.0)
EOS ABS: 0.2 10*3/uL (ref 0.0–0.7)
Eosinophils Relative: 2.7 % (ref 0.0–5.0)
HCT: 40.4 % (ref 36.0–46.0)
Hemoglobin: 13.7 g/dL (ref 12.0–15.0)
LYMPHS ABS: 2 10*3/uL (ref 0.7–4.0)
Lymphocytes Relative: 32.8 % (ref 12.0–46.0)
MCHC: 33.8 g/dL (ref 30.0–36.0)
MCV: 92.5 fl (ref 78.0–100.0)
MONO ABS: 0.4 10*3/uL (ref 0.1–1.0)
Monocytes Relative: 6.7 % (ref 3.0–12.0)
NEUTROS ABS: 3.5 10*3/uL (ref 1.4–7.7)
NEUTROS PCT: 57 % (ref 43.0–77.0)
PLATELETS: 240 10*3/uL (ref 150.0–400.0)
RBC: 4.37 Mil/uL (ref 3.87–5.11)
RDW: 12.8 % (ref 11.5–15.5)
WBC: 6.1 10*3/uL (ref 4.0–10.5)

## 2016-05-20 LAB — LIPID PANEL
CHOL/HDL RATIO: 3
CHOLESTEROL: 172 mg/dL (ref 0–200)
HDL: 67.9 mg/dL (ref >39.00)
LDL CALC: 94 mg/dL (ref 0–99)
NonHDL: 104.47
TRIGLYCERIDES: 54 mg/dL (ref 0.0–149.0)
VLDL: 10.8 mg/dL (ref 0.0–40.0)

## 2016-05-20 LAB — TSH: TSH: 0.71 u[IU]/mL (ref 0.35–4.50)

## 2016-05-20 LAB — HEMOGLOBIN A1C: Hgb A1c MFr Bld: 5.4 % (ref 4.6–6.5)

## 2016-05-20 MED ORDER — BUPROPION HCL ER (SR) 150 MG PO TB12: 150.0000 mg | ORAL_TABLET | Freq: Two times a day (BID) | ORAL | Status: DC

## 2016-05-20 NOTE — Assessment & Plan Note (Signed)
Patient would like to restart Wellbutrin. Will restart once daily x 3 days then increase to 150 mg BID.

## 2016-05-20 NOTE — Progress Notes (Signed)
Patient presents to clinic today for annual exam.  Patient is fasting for labs.  Patient endorses history of thyroid nodules in 2014 with biopsy revealing benign etiology. She denies any follow-up for this. Denies concerns at present but would like thyroid function checked.   Health Maintenance: Immunizations -- up-to-date Colonoscopy -- Up-to-date per patient.  Mammogram -- Overdue. Denies history of abnormal mammogram.  PAP -- overdue. Endorses + history of abnormal PAP smears. Declines today. Wants to see GYN   Past Medical History  Diagnosis Date  . Thyroid disease     Nodules 2014, benign  . History of chicken pox   . Shingles   . Colon polyps     Benign  . UTI (lower urinary tract infection)     Past Surgical History  Procedure Laterality Date  . Cholecystectomy      2012  . Cesarean section      1996  . Wisdom tooth extraction      Current Outpatient Prescriptions on File Prior to Visit  Medication Sig Dispense Refill  . Cholecalciferol (VITAMIN D3) 5000 UNITS CAPS Take 5,000 Units by mouth daily.    . cyclobenzaprine (FLEXERIL) 10 MG tablet Take 1 tablet (10 mg total) by mouth at bedtime. 30 tablet 5  . HYDROcodone-acetaminophen (NORCO) 7.5-325 MG per tablet Take 1 tablet by mouth every 6 (six) hours as needed for moderate pain.    . Multiple Vitamin (MULTIVITAMIN) tablet Take 1 tablet by mouth daily.    . naproxen (NAPROSYN) 250 MG tablet Take by mouth 2 (two) times daily as needed.     . nortriptyline (PAMELOR) 10 MG capsule Take 2 capsules (20 mg total) by mouth at bedtime. 180 capsule 3   No current facility-administered medications on file prior to visit.    No Known Allergies  Family History  Problem Relation Age of Onset  . Lung cancer Mother 59    Deceased  . Prostate cancer Father 23    Deceased  . Pancreatic cancer Paternal Uncle   . Healthy Brother     x3  . Healthy Sister     x2  . Menstrual problems Daughter     x1    Social  History   Social History  . Marital Status: Married    Spouse Name: N/A  . Number of Children: 1  . Years of Education: N/A   Occupational History  . Not on file.   Social History Main Topics  . Smoking status: Current Every Day Smoker -- 0.50 packs/day for 25 years  . Smokeless tobacco: Never Used  . Alcohol Use: 1.2 - 1.8 oz/week    2-3 Standard drinks or equivalent per week     Comment: social  . Drug Use: No  . Sexual Activity: Not Currently   Other Topics Concern  . Not on file   Social History Narrative   Lives with husband in a 2 story home.  Has one daughter. Lives in [place] but is moving back to the [place]  Self employed.  Education: associates degree.   Review of Systems  Constitutional: Negative for fever and weight loss.  HENT: Negative for ear discharge, ear pain, hearing loss and tinnitus.   Eyes: Negative for blurred vision, double vision, photophobia and pain.  Respiratory: Negative for cough and shortness of breath.   Cardiovascular: Negative for chest pain and palpitations.  Gastrointestinal: Negative for heartburn, nausea, vomiting, abdominal pain, diarrhea, constipation, blood in stool and melena.  Genitourinary: Negative  for dysuria, urgency, frequency, hematuria and flank pain.  Musculoskeletal: Negative for falls.  Neurological: Negative for dizziness, loss of consciousness and headaches.  Endo/Heme/Allergies: Negative for environmental allergies.  Psychiatric/Behavioral: Negative for depression, suicidal ideas, hallucinations and substance abuse. The patient is not nervous/anxious and does not have insomnia.    BP 113/60 mmHg  Pulse 88  Temp(Src) 97.7 F (36.5 C) (Oral)  Resp 16  Ht  (1.651 m)  Wt 141 lb 8 oz (64.184 kg)  BMI 23.55 kg/m2  SpO2 100%  Physical Exam  Constitutional: She is oriented to person, place, and time and well-developed, well-nourished, and in no distress.  HENT:  Head: Normocephalic and atraumatic.  Right Ear:  Tympanic membrane, external ear and ear canal normal.  Left Ear: Tympanic membrane, external ear and ear canal normal.  Nose: Nose normal. No mucosal edema.  Mouth/Throat: Uvula is midline, oropharynx is clear and moist and mucous membranes are normal. No oropharyngeal exudate or posterior oropharyngeal erythema.  Eyes: Conjunctivae are normal. Pupils are equal, round, and reactive to light.  Neck: Neck supple. No thyromegaly present.  No palpable thyroid nodules on examination  Cardiovascular: Normal rate, regular rhythm, normal heart sounds and intact distal pulses.   Pulmonary/Chest: Effort normal and breath sounds normal. No respiratory distress. She has no wheezes. She has no rales.  Abdominal: Soft. Bowel sounds are normal. She exhibits no distension and no mass. There is no tenderness. There is no rebound and no guarding.  Lymphadenopathy:    She has no cervical adenopathy.  Neurological: She is alert and oriented to person, place, and time. No cranial nerve deficit.  Skin: Skin is warm and dry. No rash noted.  Psychiatric: Affect normal.  Vitals reviewed.   Recent Results (from the past 2160 hour(s))  TSH     Status: None   Collection Time: 05/20/16  9:15 AM  Result Value Ref Range   TSH 0.71 0.35 - 4.50 uIU/mL  Urinalysis, Routine w reflex microscopic (not at [facility])     Status: Abnormal   Collection Time: 05/20/16  9:15 AM  Result Value Ref Range   Color, Urine YELLOW Yellow;Lt. Yellow   APPearance CLEAR Clear   Specific Gravity, Urine <=1.005 (A) 1.000 - 1.030   pH 6.0 5.0 - 8.0   Total Protein, Urine NEGATIVE Negative   Urine Glucose NEGATIVE Negative   Ketones, ur NEGATIVE Negative   Bilirubin Urine NEGATIVE Negative   Hgb urine dipstick NEGATIVE Negative   Urobilinogen, UA 0.2 0.0 - 1.0   Leukocytes, UA NEGATIVE Negative   Nitrite NEGATIVE Negative   WBC, UA none seen 0-2/hpf   RBC / HPF none seen 0-2/hpf   Squamous Epithelial / LPF Rare(0-4/hpf) Rare(0-4/hpf)    Lipid panel     Status: None   Collection Time: 05/20/16  9:15 AM  Result Value Ref Range   Cholesterol 172 0 - 200 mg/dL    Comment: ATP III Classification       Desirable:  < 200 mg/dL               Borderline High:  200 - 239 mg/dL          High:  > = 161 mg/dL   Triglycerides 09.6 0.0 - 149.0 mg/dL    Comment: Normal:  <045 mg/dLBorderline High:  150 - 199 mg/dL   HDL 40.98 >11.91 mg/dL   VLDL 47.8 0.0 - 29.5 mg/dL   LDL Cholesterol 94 0 - 99 mg/dL   Total  CHOL/HDL Ratio 3     Comment:                Men          Women1/2 Average Risk     3.4          3.3Average Risk          5.0          4.42X Average Risk          9.6          7.13X Average Risk          15.0          11.0                       NonHDL 104.47     Comment: NOTE:  Non-HDL goal should be 30 mg/dL higher than patient's LDL goal (i.e. LDL goal of < 70 mg/dL, would have non-HDL goal of < 100 mg/dL)  CBC with Differential/Platelet     Status: None   Collection Time: 05/20/16  9:15 AM  Result Value Ref Range   WBC 6.1 4.0 - 10.5 K/uL   RBC 4.37 3.87 - 5.11 Mil/uL   Hemoglobin 13.7 12.0 - 15.0 g/dL   HCT 16.1 09.6 - 04.5 %   MCV 92.5 78.0 - 100.0 fl   MCHC 33.8 30.0 - 36.0 g/dL   RDW 40.9 81.1 - 91.4 %   Platelets 240.0 150.0 - 400.0 K/uL   Neutrophils Relative % 57.0 43.0 - 77.0 %   Lymphocytes Relative 32.8 12.0 - 46.0 %   Monocytes Relative 6.7 3.0 - 12.0 %   Eosinophils Relative 2.7 0.0 - 5.0 %   Basophils Relative 0.8 0.0 - 3.0 %   Neutro Abs 3.5 1.4 - 7.7 K/uL   Lymphs Abs 2.0 0.7 - 4.0 K/uL   Monocytes Absolute 0.4 0.1 - 1.0 K/uL   Eosinophils Absolute 0.2 0.0 - 0.7 K/uL   Basophils Absolute 0.1 0.0 - 0.1 K/uL  Comprehensive metabolic panel     Status: None   Collection Time: 05/20/16  9:15 AM  Result Value Ref Range   Sodium 143 135 - 145 mEq/L   Potassium 4.4 3.5 - 5.1 mEq/L   Chloride 111 96 - 112 mEq/L   CO2 28 19 - 32 mEq/L   Glucose, Bld 90 70 - 99 mg/dL   BUN 11 6 - 23 mg/dL   Creatinine, Ser  7.82 0.40 - 1.20 mg/dL   Total Bilirubin 0.8 0.2 - 1.2 mg/dL   Alkaline Phosphatase 54 39 - 117 U/L   AST 18 0 - 37 U/L   ALT 17 0 - 35 U/L   Total Protein 6.6 6.0 - 8.3 g/dL   Albumin 4.0 3.5 - 5.2 g/dL   Calcium 9.4 8.4 - 95.6 mg/dL   GFR 21.30 >86.57 mL/min  Hemoglobin A1c     Status: None   Collection Time: 05/20/16  9:15 AM  Result Value Ref Range   Hgb A1c MFr Bld 5.4 4.6 - 6.5 %    Comment: Glycemic Control Guidelines for People with Diabetes:Non Diabetic:  <6%Goal of Therapy: <7%Additional Action Suggested:  >8%     Assessment/Plan: Visit for preventive health examination Depression screen negative. Health Maintenance reviewed -- Reviewed. Referral to GYN placed. She is going to check across the hall with Dr. Adrian Blackwater and Dr. Erin Fulling. Preventive schedule discussed and handout given in AVS. Will obtain fasting labs  today.   Breast cancer screening Order for screening mammogram placed.  Tobacco abuse disorder Patient would like to restart Wellbutrin. Will restart once daily x 3 days then increase to 150 mg BID.     Piedad Climes, PA-C

## 2016-05-20 NOTE — Progress Notes (Signed)
Pre visit review using our clinic review tool, if applicable. No additional management support is needed unless otherwise documented below in the visit note/SLS  

## 2016-05-20 NOTE — Assessment & Plan Note (Signed)
Depression screen negative. Health Maintenance reviewed -- Reviewed. Referral to GYN placed. She is going to check across the hall with Dr. Adrian Blackwater and Dr. Erin Fulling. Preventive schedule discussed and handout given in AVS. Will obtain fasting labs today.

## 2016-05-20 NOTE — Patient Instructions (Signed)
Please go to the lab for blood work.   Our office will call you with your results unless you have chosen to receive results via MyChart.  If your blood work is normal we will follow-up each year for physicals and as scheduled for chronic medical problems.  If anything is abnormal we will treat accordingly and get you in for a follow-up.  Restart the Wellbutrin -- taking once daily for 3 days before increasing to twice daily.   FU 2 months.  Preventive Care for Adults, Female A healthy lifestyle and preventive care can promote health and wellness. Preventive health guidelines for women include the following key practices.  A routine yearly physical is a good way to check with your health care provider about your health and preventive screening. It is a chance to share any concerns and updates on your health and to receive a thorough exam.  Visit your dentist for a routine exam and preventive care every 6 months. Brush your teeth twice a day and floss once a day. Good oral hygiene prevents tooth decay and gum disease.  The frequency of eye exams is based on your age, health, family medical history, use of contact lenses, and other factors. Follow your health care provider's recommendations for frequency of eye exams.  Eat a healthy diet. Foods like vegetables, fruits, whole grains, low-fat dairy products, and lean protein foods contain the nutrients you need without too many calories. Decrease your intake of foods high in solid fats, added sugars, and salt. Eat the right amount of calories for you.Get information about a proper diet from your health care provider, if necessary.  Regular physical exercise is one of the most important things you can do for your health. Most adults should get at least 150 minutes of moderate-intensity exercise (any activity that increases your heart rate and causes you to sweat) each week. In addition, most adults need muscle-strengthening exercises on 2 or more  days a week.  Maintain a healthy weight. The body mass index (BMI) is a screening tool to identify possible weight problems. It provides an estimate of body fat based on height and weight. Your health care provider can find your BMI and can help you achieve or maintain a healthy weight.For adults 20 years and older:  A BMI below 18.5 is considered underweight.  A BMI of 18.5 to 24.9 is normal.  A BMI of 25 to 29.9 is considered overweight.  A BMI of 30 and above is considered obese.  Maintain normal blood lipids and cholesterol levels by exercising and minimizing your intake of saturated fat. Eat a balanced diet with plenty of fruit and vegetables. Blood tests for lipids and cholesterol should begin at age 23 and be repeated every 5 years. If your lipid or cholesterol levels are high, you are over 50, or you are at high risk for heart disease, you may need your cholesterol levels checked more frequently.Ongoing high lipid and cholesterol levels should be treated with medicines if diet and exercise are not working.  If you smoke, find out from your health care provider how to quit. If you do not use tobacco, do not start.  Lung cancer screening is recommended for adults aged 43-80 years who are at high risk for developing lung cancer because of a history of smoking. A yearly low-dose CT scan of the lungs is recommended for people who have at least a 30-pack-year history of smoking and are a current smoker or have quit within the past  15 years. A pack year of smoking is smoking an average of 1 pack of cigarettes a day for 1 year (for example: 1 pack a day for 30 years or 2 packs a day for 15 years). Yearly screening should continue until the smoker has stopped smoking for at least 15 years. Yearly screening should be stopped for people who develop a health problem that would prevent them from having lung cancer treatment.  If you are pregnant, do not drink alcohol. If you are breastfeeding, be very  cautious about drinking alcohol. If you are not pregnant and choose to drink alcohol, do not have more than 1 drink per day. One drink is considered to be 12 ounces (355 mL) of beer, 5 ounces (148 mL) of wine, or 1.5 ounces (44 mL) of liquor.  Avoid use of street drugs. Do not share needles with anyone. Ask for help if you need support or instructions about stopping the use of drugs.  High blood pressure causes heart disease and increases the risk of stroke. Your blood pressure should be checked at least every 1 to 2 years. Ongoing high blood pressure should be treated with medicines if weight loss and exercise do not work.  If you are 92-35 years old, ask your health care provider if you should take aspirin to prevent strokes.  Diabetes screening is done by taking a blood sample to check your blood glucose level after you have not eaten for a certain period of time (fasting). If you are not overweight and you do not have risk factors for diabetes, you should be screened once every 3 years starting at age 35. If you are overweight or obese and you are 9-31 years of age, you should be screened for diabetes every year as part of your cardiovascular risk assessment.  Breast cancer screening is essential preventive care for women. You should practice "breast self-awareness." This means understanding the normal appearance and feel of your breasts and may include breast self-examination. Any changes detected, no matter how small, should be reported to a health care provider. Women in their 58s and 30s should have a clinical breast exam (CBE) by a health care provider as part of a regular health exam every 1 to 3 years. After age 4, women should have a CBE every year. Starting at age 25, women should consider having a mammogram (breast X-ray test) every year. Women who have a family history of breast cancer should talk to their health care provider about genetic screening. Women at a high risk of breast cancer  should talk to their health care providers about having an MRI and a mammogram every year.  Breast cancer gene (BRCA)-related cancer risk assessment is recommended for women who have family members with BRCA-related cancers. BRCA-related cancers include breast, ovarian, tubal, and peritoneal cancers. Having family members with these cancers may be associated with an increased risk for harmful changes (mutations) in the breast cancer genes BRCA1 and BRCA2. Results of the assessment will determine the need for genetic counseling and BRCA1 and BRCA2 testing.  Your health care provider may recommend that you be screened regularly for cancer of the pelvic organs (ovaries, uterus, and vagina). This screening involves a pelvic examination, including checking for microscopic changes to the surface of your cervix (Pap test). You may be encouraged to have this screening done every 3 years, beginning at age 82.  For women ages 107-65, health care providers may recommend pelvic exams and Pap testing every 3 years, or  they may recommend the Pap and pelvic exam, combined with testing for human papilloma virus (HPV), every 5 years. Some types of HPV increase your risk of cervical cancer. Testing for HPV may also be done on women of any age with unclear Pap test results.  Other health care providers may not recommend any screening for nonpregnant women who are considered low risk for pelvic cancer and who do not have symptoms. Ask your health care provider if a screening pelvic exam is right for you.  If you have had past treatment for cervical cancer or a condition that could lead to cancer, you need Pap tests and screening for cancer for at least 20 years after your treatment. If Pap tests have been discontinued, your risk factors (such as having a new sexual partner) need to be reassessed to determine if screening should resume. Some women have medical problems that increase the chance of getting cervical cancer. In  these cases, your health care provider may recommend more frequent screening and Pap tests.  Colorectal cancer can be detected and often prevented. Most routine colorectal cancer screening begins at the age of 29 years and continues through age 48 years. However, your health care provider may recommend screening at an earlier age if you have risk factors for colon cancer. On a yearly basis, your health care provider may provide home test kits to check for hidden blood in the stool. Use of a small camera at the end of a tube, to directly examine the colon (sigmoidoscopy or colonoscopy), can detect the earliest forms of colorectal cancer. Talk to your health care provider about this at age 62, when routine screening begins. Direct exam of the colon should be repeated every 5-10 years through age 48 years, unless early forms of precancerous polyps or small growths are found.  People who are at an increased risk for hepatitis B should be screened for this virus. You are considered at high risk for hepatitis B if:  You were born in a country where hepatitis B occurs often. Talk with your health care provider about which countries are considered high risk.  Your parents were born in a high-risk country and you have not received a shot to protect against hepatitis B (hepatitis B vaccine).  You have HIV or AIDS.  You use needles to inject street drugs.  You live with, or have sex with, someone who has hepatitis B.  You get hemodialysis treatment.  You take certain medicines for conditions like cancer, organ transplantation, and autoimmune conditions.  Hepatitis C blood testing is recommended for all people born from [birth year] through 1965 and any individual with known risks for hepatitis C.  Practice safe sex. Use condoms and avoid high-risk sexual practices to reduce the spread of sexually transmitted infections (STIs). STIs include gonorrhea, chlamydia, syphilis, trichomonas, herpes, HPV, and human  immunodeficiency virus (HIV). Herpes, HIV, and HPV are viral illnesses that have no cure. They can result in disability, cancer, and death.  You should be screened for sexually transmitted illnesses (STIs) including gonorrhea and chlamydia if:  You are sexually active and are younger than 24 years.  You are older than 24 years and your health care provider tells you that you are at risk for this type of infection.  Your sexual activity has changed since you were last screened and you are at an increased risk for chlamydia or gonorrhea. Ask your health care provider if you are at risk.  If you are at risk of  being infected with HIV, it is recommended that you take a prescription medicine daily to prevent HIV infection. This is called preexposure prophylaxis (PrEP). You are considered at risk if:  You are sexually active and do not regularly use condoms or know the HIV status of your partner(s).  You take drugs by injection.  You are sexually active with a partner who has HIV.  Talk with your health care provider about whether you are at high risk of being infected with HIV. If you choose to begin PrEP, you should first be tested for HIV. You should then be tested every 3 months for as long as you are taking PrEP.  Osteoporosis is a disease in which the bones lose minerals and strength with aging. This can result in serious bone fractures or breaks. The risk of osteoporosis can be identified using a bone density scan. Women ages 77 years and over and women at risk for fractures or osteoporosis should discuss screening with their health care providers. Ask your health care provider whether you should take a calcium supplement or vitamin D to reduce the rate of osteoporosis.  Menopause can be associated with physical symptoms and risks. Hormone replacement therapy is available to decrease symptoms and risks. You should talk to your health care provider about whether hormone replacement therapy is  right for you.  Use sunscreen. Apply sunscreen liberally and repeatedly throughout the day. You should seek shade when your shadow is shorter than you. Protect yourself by wearing long sleeves, pants, a wide-brimmed hat, and sunglasses year round, whenever you are outdoors.  Once a month, do a whole body skin exam, using a mirror to look at the skin on your back. Tell your health care provider of new moles, moles that have irregular borders, moles that are larger than a pencil eraser, or moles that have changed in shape or color.  Stay current with required vaccines (immunizations).  Influenza vaccine. All adults should be immunized every year.  Tetanus, diphtheria, and acellular pertussis (Td, Tdap) vaccine. Pregnant women should receive 1 dose of Tdap vaccine during each pregnancy. The dose should be obtained regardless of the length of time since the last dose. Immunization is preferred during the 27th-36th week of gestation. An adult who has not previously received Tdap or who does not know her vaccine status should receive 1 dose of Tdap. This initial dose should be followed by tetanus and diphtheria toxoids (Td) booster doses every 10 years. Adults with an unknown or incomplete history of completing a 3-dose immunization series with Td-containing vaccines should begin or complete a primary immunization series including a Tdap dose. Adults should receive a Td booster every 10 years.  Varicella vaccine. An adult without evidence of immunity to varicella should receive 2 doses or a second dose if she has previously received 1 dose. Pregnant females who do not have evidence of immunity should receive the first dose after pregnancy. This first dose should be obtained before leaving the health care facility. The second dose should be obtained 4-8 weeks after the first dose.  Human papillomavirus (HPV) vaccine. Females aged 13-26 years who have not received the vaccine previously should obtain the  3-dose series. The vaccine is not recommended for use in pregnant females. However, pregnancy testing is not needed before receiving a dose. If a female is found to be pregnant after receiving a dose, no treatment is needed. In that case, the remaining doses should be delayed until after the pregnancy. Immunization is recommended  for any person with an immunocompromised condition through the age of 49 years if she did not get any or all doses earlier. During the 3-dose series, the second dose should be obtained 4-8 weeks after the first dose. The third dose should be obtained 24 weeks after the first dose and 16 weeks after the second dose.  Zoster vaccine. One dose is recommended for adults aged 53 years or older unless certain conditions are present.  Measles, mumps, and rubella (MMR) vaccine. Adults born before [birth year] generally are considered immune to measles and mumps. Adults born in [birth year] or later should have 1 or more doses of MMR vaccine unless there is a contraindication to the vaccine or there is laboratory evidence of immunity to each of the three diseases. A routine second dose of MMR vaccine should be obtained at least 28 days after the first dose for students attending postsecondary schools, health care workers, or international travelers. People who received inactivated measles vaccine or an unknown type of measles vaccine during 1963-1967 should receive 2 doses of MMR vaccine. People who received inactivated mumps vaccine or an unknown type of mumps vaccine before 1979 and are at high risk for mumps infection should consider immunization with 2 doses of MMR vaccine. For females of childbearing age, rubella immunity should be determined. If there is no evidence of immunity, females who are not pregnant should be vaccinated. If there is no evidence of immunity, females who are pregnant should delay immunization until after pregnancy. Unvaccinated health care workers born before [birth year] who lack  laboratory evidence of measles, mumps, or rubella immunity or laboratory confirmation of disease should consider measles and mumps immunization with 2 doses of MMR vaccine or rubella immunization with 1 dose of MMR vaccine.  Pneumococcal 13-valent conjugate (PCV13) vaccine. When indicated, a person who is uncertain of his immunization history and has no record of immunization should receive the PCV13 vaccine. All adults 42 years of age and older should receive this vaccine. An adult aged 31 years or older who has certain medical conditions and has not been previously immunized should receive 1 dose of PCV13 vaccine. This PCV13 should be followed with a dose of pneumococcal polysaccharide (PPSV23) vaccine. Adults who are at high risk for pneumococcal disease should obtain the PPSV23 vaccine at least 8 weeks after the dose of PCV13 vaccine. Adults older than 54 years of age who have normal immune system function should obtain the PPSV23 vaccine dose at least 1 year after the dose of PCV13 vaccine.  Pneumococcal polysaccharide (PPSV23) vaccine. When PCV13 is also indicated, PCV13 should be obtained first. All adults aged 1 years and older should be immunized. An adult younger than age 41 years who has certain medical conditions should be immunized. Any person who resides in a nursing home or long-term care facility should be immunized. An adult smoker should be immunized. People with an immunocompromised condition and certain other conditions should receive both PCV13 and PPSV23 vaccines. People with human immunodeficiency virus (HIV) infection should be immunized as soon as possible after diagnosis. Immunization during chemotherapy or radiation therapy should be avoided. Routine use of PPSV23 vaccine is not recommended for American Indians, [place]atives, or people younger than 65 years unless there are medical conditions that require PPSV23 vaccine. When indicated, people who have unknown immunization and have  no record of immunization should receive PPSV23 vaccine. One-time revaccination 5 years after the first dose of PPSV23 is recommended for people aged 19-64 years who have chronic  kidney failure, nephrotic syndrome, asplenia, or immunocompromised conditions. People who received 1-2 doses of PPSV23 before age 1 years should receive another dose of PPSV23 vaccine at age 91 years or later if at least 5 years have passed since the previous dose. Doses of PPSV23 are not needed for people immunized with PPSV23 at or after age 50 years.  Meningococcal vaccine. Adults with asplenia or persistent complement component deficiencies should receive 2 doses of quadrivalent meningococcal conjugate (MenACWY-D) vaccine. The doses should be obtained at least 2 months apart. Microbiologists working with certain meningococcal bacteria, [employer] recruits, people at risk during an outbreak, and people who travel to or live in countries with a high rate of meningitis should be immunized. A first-year college student up through age 54 years who is living in a residence hall should receive a dose if she did not receive a dose on or after her 16th birthday. Adults who have certain high-risk conditions should receive one or more doses of vaccine.  Hepatitis A vaccine. Adults who wish to be protected from this disease, have certain high-risk conditions, work with hepatitis A-infected animals, work in hepatitis A research labs, or travel to or work in countries with a high rate of hepatitis A should be immunized. Adults who were previously unvaccinated and who anticipate close contact with an international adoptee during the first 60 days after arrival in the [place] from a country with a high rate of hepatitis A should be immunized.  Hepatitis B vaccine. Adults who wish to be protected from this disease, have certain high-risk conditions, may be exposed to blood or other infectious body fluids, are household contacts or sex  partners of hepatitis B positive people, are clients or workers in certain care facilities, or travel to or work in countries with a high rate of hepatitis B should be immunized.  Haemophilus influenzae type b (Hib) vaccine. A previously unvaccinated person with asplenia or sickle cell disease or having a scheduled splenectomy should receive 1 dose of Hib vaccine. Regardless of previous immunization, a recipient of a hematopoietic stem cell transplant should receive a 3-dose series 6-12 months after her successful transplant. Hib vaccine is not recommended for adults with HIV infection. Preventive Services / Frequency Ages 77 to 22 years  Blood pressure check.** / Every 3-5 years.  Lipid and cholesterol check.** / Every 5 years beginning at age 36.  Clinical breast exam.** / Every 3 years for women in their 46s and 73s.  BRCA-related cancer risk assessment.** / For women who have family members with a BRCA-related cancer (breast, ovarian, tubal, or peritoneal cancers).  Pap test.** / Every 2 years from ages 56 through 27. Every 3 years starting at age 62 through age 35 or 17 with a history of 3 consecutive normal Pap tests.  HPV screening.** / Every 3 years from ages 55 through ages 78 to 55 with a history of 3 consecutive normal Pap tests.  Hepatitis C blood test.** / For any individual with known risks for hepatitis C.  Skin self-exam. / Monthly.  Influenza vaccine. / Every year.  Tetanus, diphtheria, and acellular pertussis (Tdap, Td) vaccine.** / Consult your health care provider. Pregnant women should receive 1 dose of Tdap vaccine during each pregnancy. 1 dose of Td every 10 years.  Varicella vaccine.** / Consult your health care provider. Pregnant females who do not have evidence of immunity should receive the first dose after pregnancy.  HPV vaccine. / 3 doses over 6 months, if 26 and  younger. The vaccine is not recommended for use in pregnant females. However, pregnancy testing  is not needed before receiving a dose.  Measles, mumps, rubella (MMR) vaccine.** / You need at least 1 dose of MMR if you were born in 1957 or later. You may also need a 2nd dose. For females of childbearing age, rubella immunity should be determined. If there is no evidence of immunity, females who are not pregnant should be vaccinated. If there is no evidence of immunity, females who are pregnant should delay immunization until after pregnancy.  Pneumococcal 13-valent conjugate (PCV13) vaccine.** / Consult your health care provider.  Pneumococcal polysaccharide (PPSV23) vaccine.** / 1 to 2 doses if you smoke cigarettes or if you have certain conditions.  Meningococcal vaccine.** / 1 dose if you are age 44 to 66 years and a Market researcher living in a residence hall, or have one of several medical conditions, you need to get vaccinated against meningococcal disease. You may also need additional booster doses.  Hepatitis A vaccine.** / Consult your health care provider.  Hepatitis B vaccine.** / Consult your health care provider.  Haemophilus influenzae type b (Hib) vaccine.** / Consult your health care provider. Ages 42 to 11 years  Blood pressure check.** / Every year.  Lipid and cholesterol check.** / Every 5 years beginning at age 59 years.  Lung cancer screening. / Every year if you are aged 74-80 years and have a 30-pack-year history of smoking and currently smoke or have quit within the past 15 years. Yearly screening is stopped once you have quit smoking for at least 15 years or develop a health problem that would prevent you from having lung cancer treatment.  Clinical breast exam.** / Every year after age 34 years.  BRCA-related cancer risk assessment.** / For women who have family members with a BRCA-related cancer (breast, ovarian, tubal, or peritoneal cancers).  Mammogram.** / Every year beginning at age 8 years and continuing for as long as you are in good  health. Consult with your health care provider.  Pap test.** / Every 3 years starting at age 62 years through age 76 or 13 years with a history of 3 consecutive normal Pap tests.  HPV screening.** / Every 3 years from ages 58 years through ages 61 to 52 years with a history of 3 consecutive normal Pap tests.  Fecal occult blood test (FOBT) of stool. / Every year beginning at age 50 years and continuing until age 42 years. You may not need to do this test if you get a colonoscopy every 10 years.  Flexible sigmoidoscopy or colonoscopy.** / Every 5 years for a flexible sigmoidoscopy or every 10 years for a colonoscopy beginning at age 32 years and continuing until age 87 years.  Hepatitis C blood test.** / For all people born from [birth year] through 1964 and any individual with known risks for hepatitis C.  Skin self-exam. / Monthly.  Influenza vaccine. / Every year.  Tetanus, diphtheria, and acellular pertussis (Tdap/Td) vaccine.** / Consult your health care provider. Pregnant women should receive 1 dose of Tdap vaccine during each pregnancy. 1 dose of Td every 10 years.  Varicella vaccine.** / Consult your health care provider. Pregnant females who do not have evidence of immunity should receive the first dose after pregnancy.  Zoster vaccine.** / 1 dose for adults aged 47 years or older.  Measles, mumps, rubella (MMR) vaccine.** / You need at least 1 dose of MMR if you were born in 1957 or  later. You may also need a second dose. For females of childbearing age, rubella immunity should be determined. If there is no evidence of immunity, females who are not pregnant should be vaccinated. If there is no evidence of immunity, females who are pregnant should delay immunization until after pregnancy.  Pneumococcal 13-valent conjugate (PCV13) vaccine.** / Consult your health care provider.  Pneumococcal polysaccharide (PPSV23) vaccine.** / 1 to 2 doses if you smoke cigarettes or if you have certain  conditions.  Meningococcal vaccine.** / Consult your health care provider.  Hepatitis A vaccine.** / Consult your health care provider.  Hepatitis B vaccine.** / Consult your health care provider.  Haemophilus influenzae type b (Hib) vaccine.** / Consult your health care provider. Ages 63 years and over  Blood pressure check.** / Every year.  Lipid and cholesterol check.** / Every 5 years beginning at age 17 years.  Lung cancer screening. / Every year if you are aged 69-80 years and have a 30-pack-year history of smoking and currently smoke or have quit within the past 15 years. Yearly screening is stopped once you have quit smoking for at least 15 years or develop a health problem that would prevent you from having lung cancer treatment.  Clinical breast exam.** / Every year after age 24 years.  BRCA-related cancer risk assessment.** / For women who have family members with a BRCA-related cancer (breast, ovarian, tubal, or peritoneal cancers).  Mammogram.** / Every year beginning at age 29 years and continuing for as long as you are in good health. Consult with your health care provider.  Pap test.** / Every 3 years starting at age 54 years through age 58 or 32 years with 3 consecutive normal Pap tests. Testing can be stopped between 65 and 70 years with 3 consecutive normal Pap tests and no abnormal Pap or HPV tests in the past 10 years.  HPV screening.** / Every 3 years from ages 28 years through ages 72 or 65 years with a history of 3 consecutive normal Pap tests. Testing can be stopped between 65 and 70 years with 3 consecutive normal Pap tests and no abnormal Pap or HPV tests in the past 10 years.  Fecal occult blood test (FOBT) of stool. / Every year beginning at age 37 years and continuing until age 99 years. You may not need to do this test if you get a colonoscopy every 10 years.  Flexible sigmoidoscopy or colonoscopy.** / Every 5 years for a flexible sigmoidoscopy or every 10  years for a colonoscopy beginning at age 49 years and continuing until age 19 years.  Hepatitis C blood test.** / For all people born from [birth year] through 1964 and any individual with known risks for hepatitis C.  Osteoporosis screening.** / A one-time screening for women ages 46 years and over and women at risk for fractures or osteoporosis.  Skin self-exam. / Monthly.  Influenza vaccine. / Every year.  Tetanus, diphtheria, and acellular pertussis (Tdap/Td) vaccine.** / 1 dose of Td every 10 years.  Varicella vaccine.** / Consult your health care provider.  Zoster vaccine.** / 1 dose for adults aged 49 years or older.  Pneumococcal 13-valent conjugate (PCV13) vaccine.** / Consult your health care provider.  Pneumococcal polysaccharide (PPSV23) vaccine.** / 1 dose for all adults aged 49 years and older.  Meningococcal vaccine.** / Consult your health care provider.  Hepatitis A vaccine.** / Consult your health care provider.  Hepatitis B vaccine.** / Consult your health care provider.  Haemophilus influenzae type b (  Hib) vaccine.** / Consult your health care provider. ** Family history and personal history of risk and conditions may change your health care provider's recommendations.   This information is not intended to replace advice given to you by your health care provider. Make sure you discuss any questions you have with your health care provider.   Document Released: 12/13/2001 Document Revised: 11/07/2014 Document Reviewed: 03/14/2011 Elsevier Interactive Patient Education [employer].

## 2016-05-20 NOTE — Assessment & Plan Note (Signed)
Order for screening mammogram placed.  

## 2016-07-29 ENCOUNTER — Other Ambulatory Visit: Payer: Self-pay | Admitting: Physician Assistant

## 2016-07-29 MED ORDER — BUPROPION HCL ER (SR) 150 MG PO TB12: 150.0000 mg | ORAL_TABLET | Freq: Two times a day (BID) | ORAL | 1 refills | Status: DC

## 2016-08-04 ENCOUNTER — Encounter: Payer: Self-pay | Admitting: Neurology

## 2016-08-04 ENCOUNTER — Encounter: Payer: Self-pay | Admitting: Physician Assistant

## 2016-08-04 DIAGNOSIS — Z1159 Encounter for screening for other viral diseases: Secondary | ICD-10-CM

## 2016-08-05 MED ORDER — CYCLOBENZAPRINE HCL 10 MG PO TABS: 10.0000 mg | ORAL_TABLET | Freq: Every day | ORAL | 5 refills | Status: DC

## 2016-08-05 NOTE — Telephone Encounter (Signed)
Dr. Allena KatzPatel- see mychart message.  Do you need all three MRI's repeated?

## 2016-08-05 NOTE — Telephone Encounter (Signed)
Ok for patient to have TDaP by Charity fundraiser.

## 2016-08-22 ENCOUNTER — Ambulatory Visit
Admission: RE | Admit: 2016-08-22 | Discharge: 2016-08-22 | Disposition: A | Discharge status: Home / Self Care | Payer: BLUE CROSS/BLUE SHIELD | Source: Ambulatory Visit | Attending: Physician Assistant | Admitting: Physician Assistant

## 2016-08-22 DIAGNOSIS — Z1239 Encounter for other screening for malignant neoplasm of breast: Secondary | ICD-10-CM

## 2016-08-22 DIAGNOSIS — Z1231 Encounter for screening mammogram for malignant neoplasm of breast: Secondary | ICD-10-CM | POA: Diagnosis not present

## 2016-08-23 NOTE — Progress Notes (Signed)
Pt has seen results on MyChart and message also sent for patient to call back if any questions.

## 2016-08-24 ENCOUNTER — Encounter: Payer: Self-pay | Admitting: Physician Assistant

## 2016-08-24 NOTE — Telephone Encounter (Signed)
error:[id number] ° °

## 2016-08-26 ENCOUNTER — Ambulatory Visit (INDEPENDENT_AMBULATORY_CARE_PROVIDER_SITE_OTHER): Payer: BLUE CROSS/BLUE SHIELD | Admitting: *Deleted

## 2016-08-26 DIAGNOSIS — Z1159 Encounter for screening for other viral diseases: Secondary | ICD-10-CM | POA: Diagnosis not present

## 2016-08-26 DIAGNOSIS — Z23 Encounter for immunization: Secondary | ICD-10-CM

## 2016-08-26 LAB — HEPATITIS C ANTIBODY: HCV AB: NEGATIVE

## 2016-08-26 NOTE — Addendum Note (Signed)
Addended by: Eustace QuailEABOLD, Kentavious Michele J on: 08/26/2016 10:51 AM   Modules accepted: Orders

## 2016-08-26 NOTE — Progress Notes (Signed)
Pre visit review using our clinic review tool, if applicable. No additional management support is needed unless otherwise documented below in the visit note.  Patient tolerated injection well.  Mady Oubre J Ascencion Stegner, RN 

## 2016-09-07 ENCOUNTER — Ambulatory Visit (INDEPENDENT_AMBULATORY_CARE_PROVIDER_SITE_OTHER): Payer: BLUE CROSS/BLUE SHIELD | Admitting: Neurology

## 2016-09-07 ENCOUNTER — Encounter: Payer: Self-pay | Admitting: Neurology

## 2016-09-07 VITALS — BP 110/70 | HR 94 | Ht 65.0 in | Wt 148.1 lb

## 2016-09-07 DIAGNOSIS — M62838 Other muscle spasm: Secondary | ICD-10-CM

## 2016-09-07 DIAGNOSIS — G373 Acute transverse myelitis in demyelinating disease of central nervous system: Secondary | ICD-10-CM | POA: Diagnosis not present

## 2016-09-07 DIAGNOSIS — G379 Demyelinating disease of central nervous system, unspecified: Secondary | ICD-10-CM

## 2016-09-07 MED ORDER — CYCLOBENZAPRINE HCL 10 MG PO TABS: 5.0000 mg | ORAL_TABLET | Freq: Every day | ORAL | 0 refills | Status: DC

## 2016-09-07 MED ORDER — NORTRIPTYLINE HCL 10 MG PO CAPS: 10.0000 mg | ORAL_CAPSULE | Freq: Every day | ORAL | 0 refills | Status: DC

## 2016-09-07 NOTE — Progress Notes (Signed)
Follow-up Visit   Date: 09/07/16    Regina Griffin MRN: [id number] DOB: 1961/04/08   Interim History: Regina Griffin is a 55 y.o. 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  The patient was accompanied to the clinic by self.  History of present illness: She has been living in [place], [place] since December 2014 and moved to [place], [place] in July 2016.   Starting in February 2015, she starting having bilateral leg cramps and thought it was due her low back. In Janaury 2016, she developed tingling and numbness of the left foot, which again she attributed to her low back. Over two weeks, tingling started involving her thigh, abdomen, and up to her rib cage. She did accupuncture which helped her weakness and pain, but did not change her tingling. In March, she woke up with right foot numbness and over two days, progressed to involve her entire right side to her ribs. Eventually, she was unable to even stand due to weakness and she lost function of her right leg. She was admitted to hospital where imaging of her thoracic spine who showed new upper and mid thoracic T2 hyperintensity. MRI brain was normal. CSF testing showed positive oligoclonal bands. NMO antibody was negative. She completed methlyprednisolone 1g for 5 days followed by oral prednisone taper which improved her motor function where she was discharged with a walker. She was using a walker until April. She had continued hypersensitivity of the legs.   She was being followed by Dr. Kelli Hope, neurologist in [place], [place] and last evaluated in May 2016 at which time it was discussed whether this episode was an isolated event or the first presentation of multiple sclerosis and whether to start disease modifying therapy. Because she was clinically improving, the decision for  expectant approach was made and she was told to establish care once she moved to [facility].  No prior history of vision loss or weakness. She reports having right hand paresthesias in 2000s, which was attributed to her neck disc herniation.   UPDATE 11/25/2015:  At her last visit, she was started on baclofen and gabapentin, but due to sleepiness she stopped this.  She continues to have tingling and hypersensitivity over rib cage down to her feet, which has not improve nor worsened from her last visit.   She no longer experiences any weakness of the legs and join [employer] and started stretching which helps.  She no longer has muscle cramps and feels that flexeril 10mg  at bedtime helps with this.  There has been no new symptoms.  She had a dilated eye exam and said that there was no changes of the optic nerve.  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    UPDATE 04/25/2016:  No new complaints.  She is here to discuss surveillance imaging of the brain, cervical, and thoracic spine (see below). She did research medications used for MS and is concerned about the potential side effects.   She reports having frequent bouts of shingles which occurs every few months and is worried that because they are immunosuppressant medications, she will have worsening episodes of shingles.  Currently, she denies any vision changes, numbness/tingling, or weakness. Her legs continue to be achy at the end of the day.   UPDATE 09/07/2016:  No new complaints today.  She continues to have some tightness  around her abdomin which has been present since her initial event due to her thoracic lesion, but nothing worse than before.  She occasionally has tingling of the legs which is also old.  She denies any new vision changes, weakness, numbness/tingling.  She had a routine eye appointment today.  No interval falls, hospitalizations, or illnesses.   Medications:  Current Outpatient Prescriptions on File  Prior to Visit  Medication Sig Dispense Refill  . buPROPion (WELLBUTRIN SR) 150 MG 12 hr tablet Take 1 tablet (150 mg total) by mouth 2 (two) times daily. 180 tablet 1  . Cholecalciferol (VITAMIN D3) 5000 UNITS CAPS Take 5,000 Units by mouth daily.    Marland Kitchen HYDROcodone-acetaminophen (NORCO) 7.5-325 MG per tablet Take 1 tablet by mouth every 6 (six) hours as needed for moderate pain.    . Multiple Vitamin (MULTIVITAMIN) tablet Take 1 tablet by mouth daily.    . naproxen (NAPROSYN) 250 MG tablet Take by mouth 2 (two) times daily as needed.      No current facility-administered medications on file prior to visit.     Allergies: No Known Allergies  Review of Systems:  CONSTITUTIONAL: No fevers, chills, night sweats, or weight loss.  EYES: No visual changes or eye pain ENT: No hearing changes.  No history of nose bleeds.   RESPIRATORY: No cough, wheezing and shortness of breath.   CARDIOVASCULAR: Negative for chest pain, and palpitations.   GI: Negative for abdominal discomfort, blood in stools or black stools.  No recent change in bowel habits.   GU:  No history of incontinence.   MUSCLOSKELETAL: No history of joint pain or swelling.  No myalgias.   SKIN: Negative for lesions, rash, and itching.   ENDOCRINE: Negative for cold or heat intolerance, polydipsia or goiter.   PSYCH:  No depression or anxiety symptoms.   NEURO: As Above.   Vital Signs:  BP 110/70   Pulse 94   Ht 5\' 5"  (1.651 m)   Wt 148 lb 2 oz (67.2 kg)   SpO2 98%   BMI 24.65 kg/m   Neurological Exam: MENTAL STATUS including orientation to time, place, person, recent and remote memory, attention span and concentration, language, and fund of knowledge is normal.  Speech is not dysarthric.  CRANIAL NERVES: Fundoscopic exam is normal.  No visual field defects. Pupils equal round and reactive to light.  Normal conjugate, extra-ocular eye movements in all directions of gaze.  No ptosis. Normal facial sensation.  Face is  symmetric. Palate elevates symmetrically.  Tongue is midline.  MOTOR:  Motor strength is 5/5 in all extremities.  No atrophy, fasciculations or abnormal movements.  No pronator drift.  Tone is normal. MSRs:  Right                                                                 Left brachioradialis 3+  brachioradialis 2+  biceps 3+  biceps 2+  triceps 3+  triceps 2+  patellar 3+  patellar 3+  ankle jerk 2+  ankle jerk 1+  Hoffman no  Hoffman no  plantar response down  plantar response down   SENSORY:  Vibration intact throughout.  Hyperesthesias to light touch is improved.  COORDINATION/GAIT:  Normal finger-to- nose-finger and heel-to-shin.  Intact rapid alternating movements  bilaterally.  Gait narrow based and stable.  Tandem gait intact.   Data: MRI lumbar spine 12/12/2014: 1. No etiology for left L1 nerve root symptoms. Small discs in the thoracic spine at T8-T9 and T10-T11. 2. There is multilevel degenerative disc disease in the lower lumbar spine. On the left side the worse level is L5-S1 with narrowing of the left lateral recess and contact on the descending left S1 nerve root. 3. On the right side, there is stenosis of the right lateral recess at L4-L5 with probable impingement of the descending right L5 nerve root. 4. Mild canal stenosis at L3-L4 and L4-L5.  MRI brain 01/22/2015: Normal study. No evidence of demyelination.   MRI cervical spine 01/22/2015:  Multilevel degenerative spondylosis of the cervical spine. Posterior disc bulges at all levels, most marked at C5-6 and C6-7 where there is tiny focus of cord signal change at C5-6. Multilevel neural foraminal narrowing.   MRI brain wwo contrast 01/29/2016:  Normal examination with the exception of a small focus of gliosis in the white matter of the left temporal tip. No cause of the presenting symptoms is identified.   MRI cervical and thoracic spine wwo contrast 01/29/2016: Abnormal T2 signal within the dorsal  spinal cord at the T2 level. This could be due to demyelinating disease or myelitis. In correlation with the small ocus of abnormal white matter in the left temporal lobe, multiple sclerosis is possible.   Degenerative spondylosis from C2-3 through C6-7. No cord compression. Foraminal encroachment by osteophytes. There would be some potential to affect the C7 nerve roots based on foraminal narrowing.   Degenerative disc disease throughout the thoracic region as outlined above. Most of the abnormalities do not appear to significantly encroach upon the neural spaces or seem likely to be symptomatic other than possible associated a shin with back pain. At T8-9, there is a larger central disc herniation that effaces the ventral subarachnoid space and contacts the ventral aspect of the cord which could be symptomatic.  Labs 01/22/2015: CRP 1.1, TSH 0.56, CK 187   IMPRESSION/PLAN: Mrs. 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 Her symptoms vastly improved after receiving a course of methylprednisolone and she no longer has leg weakness, but continues to have hyperesthesia involving her mid back down.MRI thoracic spine from 2016 showed short segment of T2 hyperintensity over ~ T3 and T8 levels.   She had surveillance imaging of her brain, cervical, and thoracic cord March 2017 which shows a new left temporal tip hyperintensity. There was no active lesions.  With the presence of thoracic lesion and new silent left temporal lesion, the diagnosis of multiple sclerosis is most likely.  Disease modifying therapies were discussed, but since she has been clinically stable and she is apprehensive about potential side effects, including shingles, she does not wish to start anything at this time and it was decided to follow her closely with imaging in 6 months which she is due for.      She reports having recurrent outbreaks of red blisters over the medial aspect of the left index finger and sacral area, which was diagnosed as shingles. To my knowledge, recurrent shingles in uncommon and it is possible she may have another herpetic rash.  Because some immunomodulatory therapies for MS can cause shingles, she had been hesitant to start any, so this distinction as to whether she has a zoster  skin rash vs. other viral rash is important.     PLAN/RECOMMENDATIONS:  1. Repeat MRI brain, cervical, and thoracic wwo spine for surveillance  2.  DMT declined at this time.  If there are any new interval changes on MRI, low threshold to reconsider DMTs 3. Reduce nortriptyline to 10mg  at bedtime as her paresthesias are improved 4. Reduce flexeril to 5mg  at bedtime for cramps  5.  Continue vitamin D 4000 units daily 6.  Because there is some confusion as to whether the patient has shingles vs other herpetic simplex associated skin rash, I would like for her to be evaluated by her PCP during a specific outbreak, especially as shingles is very uncommon to recur.    Return to clinic in 6 months  The duration of this appointment visit was 30 minutes of face-to-face time with the patient.  Greater than 50% of this time was spent in counseling, explanation of diagnosis, planning of further management, and coordination of care.   Thank you for allowing me to participate in patient's care.  If I can answer any additional questions, I would be pleased to do so.    Sincerely,    [name], DO

## 2016-09-07 NOTE — Patient Instructions (Addendum)
1.  MRI brain, cervical spine, and thoracic spine wwo contrast 2.  Reduce notriptyline 10mg  at bedtime 3.  Reduce flexeril 5mg  at bedtime 4.  Because there is some confusion as to whether you have shingles vs other viral skin rash, follow-up with your PCP during a specific outbreak, especially as shingles is very uncommon to recur.   5.  Return to clinic in 6 months

## 2016-09-19 ENCOUNTER — Inpatient Hospital Stay: Admission: RE | Admit: 2016-09-19 | Payer: BLUE CROSS/BLUE SHIELD | Source: Ambulatory Visit

## 2016-09-19 ENCOUNTER — Ambulatory Visit
Admission: RE | Admit: 2016-09-19 | Discharge: 2016-09-19 | Disposition: A | Discharge status: Home / Self Care | Payer: BLUE CROSS/BLUE SHIELD | Source: Ambulatory Visit | Attending: Neurology | Admitting: Neurology

## 2016-09-19 ENCOUNTER — Other Ambulatory Visit: Payer: BLUE CROSS/BLUE SHIELD

## 2016-09-19 DIAGNOSIS — M5124 Other intervertebral disc displacement, thoracic region: Secondary | ICD-10-CM | POA: Diagnosis not present

## 2016-09-19 DIAGNOSIS — M50221 Other cervical disc displacement at C4-C5 level: Secondary | ICD-10-CM | POA: Diagnosis not present

## 2016-09-19 DIAGNOSIS — G373 Acute transverse myelitis in demyelinating disease of central nervous system: Secondary | ICD-10-CM

## 2016-09-19 DIAGNOSIS — M62838 Other muscle spasm: Secondary | ICD-10-CM

## 2016-09-19 DIAGNOSIS — G379 Demyelinating disease of central nervous system, unspecified: Secondary | ICD-10-CM

## 2016-09-19 DIAGNOSIS — M50222 Other cervical disc displacement at C5-C6 level: Secondary | ICD-10-CM | POA: Diagnosis not present

## 2016-09-19 MED ORDER — GADOBENATE DIMEGLUMINE 529 MG/ML IV SOLN
14.0000 mL | Freq: Once | INTRAVENOUS | Status: AC | PRN
  Administered 2016-09-19: 14 mL via INTRAVENOUS

## 2016-09-20 ENCOUNTER — Other Ambulatory Visit: Payer: Self-pay | Admitting: Physician Assistant

## 2016-09-20 ENCOUNTER — Encounter: Payer: Self-pay | Admitting: *Deleted

## 2016-09-20 DIAGNOSIS — E041 Nontoxic single thyroid nodule: Secondary | ICD-10-CM

## 2016-09-21 ENCOUNTER — Ambulatory Visit (INDEPENDENT_AMBULATORY_CARE_PROVIDER_SITE_OTHER): Payer: BLUE CROSS/BLUE SHIELD | Admitting: Family Medicine

## 2016-09-21 ENCOUNTER — Encounter: Payer: Self-pay | Admitting: Family Medicine

## 2016-09-21 VITALS — BP 110/75 | HR 88 | Ht 64.0 in | Wt 147.0 lb

## 2016-09-21 DIAGNOSIS — Z1151 Encounter for screening for human papillomavirus (HPV): Secondary | ICD-10-CM | POA: Diagnosis not present

## 2016-09-21 DIAGNOSIS — Z01419 Encounter for gynecological examination (general) (routine) without abnormal findings: Secondary | ICD-10-CM

## 2016-09-21 DIAGNOSIS — Z124 Encounter for screening for malignant neoplasm of cervix: Secondary | ICD-10-CM | POA: Diagnosis not present

## 2016-09-21 NOTE — Progress Notes (Signed)
Subjective:    Regina Griffin is a 55 y.o. female who presents for an annual exam. The patient has no complaints today. The patient is sexually active. GYN screening history: last pap: was abnormal: has history of HPV with colposcopy with biopsy in 2014, which was normal. Normal PAP in 2015, then abnormal in 2016, but no colposcopy was done. Unsure of what was abnormal about her last PAP.. . The patient participates in regular exercise: yes.   Menstrual History: OB History    Gravida Para Term Preterm AB Living   2 1     1 1    SAB TAB Ectopic Multiple Live Births       1           No LMP recorded. Patient is postmenopausal.    The following portions of the patient's history were reviewed and updated as appropriate: allergies, current medications, past family history, past medical history, past social history, past surgical history and problem list.  Review of Systems Pertinent items are noted in HPI.    Objective:   General Appearance:    Alert, cooperative, no distress, appears stated age  Head:    Normocephalic, without obvious abnormality, atraumatic  Eyes:    PERRL, conjunctiva/corneas clear, EOM's intact, fundi    benign, both eyes  Neck:   Supple, symmetrical, trachea midline, no adenopathy;    thyroid:  no enlargement/tenderness/nodules; no carotid   bruit or JVD  Back:     Symmetric, no curvature, ROM normal, no CVA tenderness  Lungs:     Clear to auscultation bilaterally, respirations unlabored  Chest Wall:    No tenderness or deformity   Heart:    Regular rate and rhythm, S1 and S2 normal, no murmur, rub   or gallop  Breast Exam:    Pt declined as she just had a mammogram.  Abdomen:     Soft, non-tender, bowel sounds active all four quadrants,    no masses, no organomegaly  Genitalia:    Normal female without lesion, discharge or tenderness.         Vaginal rugae normal.  Cervix normal in appearance.  Uterus   normal in size.  Adnexa normal, no masses or fullness    palpated.  Extremities:   Extremities normal, atraumatic, no cyanosis or edema  Pulses:   2+ and symmetric all extremities  Skin:   Skin color, texture, turgor normal, no rashes or lesions  Lymph nodes:   Cervical, supraclavicular, and axillary nodes normal  Neurologic:   CNII-XII intact, normal strength, sensation and reflexes    throughout     Assessment:    Healthy female exam.    Plan:     All questions answered. Discussed healthy lifestyle modifications. Pap smear. calcium/vitamin D

## 2016-09-21 NOTE — Patient Instructions (Signed)

## 2016-09-26 DIAGNOSIS — H25812 Combined forms of age-related cataract, left eye: Secondary | ICD-10-CM | POA: Diagnosis not present

## 2016-09-28 ENCOUNTER — Encounter: Payer: Self-pay | Admitting: Family Medicine

## 2016-09-28 DIAGNOSIS — R87810 Cervical high risk human papillomavirus (HPV) DNA test positive: Secondary | ICD-10-CM | POA: Insufficient documentation

## 2016-09-28 LAB — CYTOLOGY - PAP
DIAGNOSIS: NEGATIVE
HPV (WINDOPATH): DETECTED

## 2016-10-13 DIAGNOSIS — H25812 Combined forms of age-related cataract, left eye: Secondary | ICD-10-CM | POA: Diagnosis not present

## 2016-10-17 DIAGNOSIS — E041 Nontoxic single thyroid nodule: Secondary | ICD-10-CM | POA: Diagnosis not present

## 2016-10-19 ENCOUNTER — Other Ambulatory Visit: Payer: Self-pay | Admitting: Otolaryngology

## 2016-10-19 DIAGNOSIS — E041 Nontoxic single thyroid nodule: Secondary | ICD-10-CM

## 2016-10-20 DIAGNOSIS — H25012 Cortical age-related cataract, left eye: Secondary | ICD-10-CM | POA: Diagnosis not present

## 2016-10-27 ENCOUNTER — Ambulatory Visit
Admission: RE | Admit: 2016-10-27 | Discharge: 2016-10-27 | Disposition: A | Discharge status: Home / Self Care | Payer: BLUE CROSS/BLUE SHIELD | Source: Ambulatory Visit | Attending: Otolaryngology | Admitting: Otolaryngology

## 2016-10-27 DIAGNOSIS — E041 Nontoxic single thyroid nodule: Secondary | ICD-10-CM

## 2016-10-27 DIAGNOSIS — E042 Nontoxic multinodular goiter: Secondary | ICD-10-CM | POA: Diagnosis not present

## 2016-11-23 DIAGNOSIS — Z01818 Encounter for other preprocedural examination: Secondary | ICD-10-CM | POA: Diagnosis not present

## 2016-11-23 DIAGNOSIS — H25011 Cortical age-related cataract, right eye: Secondary | ICD-10-CM | POA: Diagnosis not present

## 2016-11-23 DIAGNOSIS — H2511 Age-related nuclear cataract, right eye: Secondary | ICD-10-CM | POA: Diagnosis not present

## 2016-11-30 DIAGNOSIS — H25011 Cortical age-related cataract, right eye: Secondary | ICD-10-CM | POA: Diagnosis not present

## 2017-03-08 ENCOUNTER — Encounter: Payer: Self-pay | Admitting: Neurology

## 2017-03-08 ENCOUNTER — Ambulatory Visit (INDEPENDENT_AMBULATORY_CARE_PROVIDER_SITE_OTHER): Payer: BLUE CROSS/BLUE SHIELD | Admitting: Neurology

## 2017-03-08 VITALS — BP 110/70 | HR 84 | Ht 64.0 in | Wt 155.1 lb

## 2017-03-08 DIAGNOSIS — M62838 Other muscle spasm: Secondary | ICD-10-CM

## 2017-03-08 DIAGNOSIS — G379 Demyelinating disease of central nervous system, unspecified: Secondary | ICD-10-CM | POA: Diagnosis not present

## 2017-03-08 NOTE — Progress Notes (Signed)
Follow-up Visit   Date: 03/08/17    Vandy Fong MRN: [id number] DOB: 16-Jun-1961   Interim History: Catheryn Slifer is a 56 y.o. 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  The patient was accompanied to the clinic by self.  History of present illness: She has been living in [place], [place] since December 2014 and moved to [place], [place] in July 2016.   Starting in February 2015, she starting having bilateral leg cramps and thought it was due her low back. In Janaury 2016, she developed tingling and numbness of the left foot, which again she attributed to her low back. Over two weeks, tingling started involving her thigh, abdomen, and up to her rib cage. She did accupuncture which helped her weakness and pain, but did not change her tingling. In March, she woke up with right foot numbness and over two days, progressed to involve her entire right side to her ribs. Eventually, she was unable to even stand due to weakness and she lost function of her right leg. She was admitted to hospital where imaging of her thoracic spine who showed new upper and mid thoracic T2 hyperintensity. MRI brain was normal. CSF testing showed positive oligoclonal bands. NMO antibody was negative. She completed methlyprednisolone 1g for 5 days followed by oral prednisone taper which improved her motor function where she was discharged with a walker. She was using a walker until April. She had continued hypersensitivity of the legs.   She was being followed by Dr. Kelli Hope, neurologist in [place], [place] and last evaluated in May 2016 at which time it was discussed whether this episode was an isolated event or the first presentation of multiple sclerosis and whether to start disease modifying therapy. Because she was clinically improving, the decision for  expectant approach was made and she was told to establish care once she moved to [facility].  No prior history of vision loss or weakness. She reports having right hand paresthesias in 2000s, which was attributed to her neck disc herniation.   UPDATE 11/25/2015:  At her last visit, she was started on baclofen and gabapentin, but due to sleepiness she stopped this.  She continues to have tingling and hypersensitivity over rib cage down to her feet, which has not improve nor worsened from her last visit.   She no longer experiences any weakness of the legs and join [employer] and started stretching which helps.  She no longer has muscle cramps and feels that flexeril 10mg  at bedtime helps with this.  There has been no new symptoms.  She had a dilated eye exam and said that there was no changes of the optic nerve.  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    UPDATE 04/25/2016:  No new complaints.  She is here to discuss surveillance imaging of the brain, cervical, and thoracic spine (see below). She did research medications used for MS and is concerned about the potential side effects.   She reports having frequent bouts of shingles which occurs every few months and is worried that because they are immunosuppressant medications, she will have worsening episodes of shingles.  Currently, she denies any vision changes, numbness/tingling, or weakness. Her legs continue to be achy at the end of the day.   UPDATE 09/07/2016:  No new complaints today.  She continues to have some tightness  around her abdomin which has been present since her initial event due to her thoracic lesion, but nothing worse than before.  She occasionally has tingling of the legs which is also old.  She denies any new vision changes, weakness, numbness/tingling.  She had a routine eye appointment today.   UPDATE 03/08/2017:  She is here for 22-month appointment and has no new complaints.  She continues to have  tingling and cramps, but because it is not as bothersome stopped taking nortriptyline.  She now takes flexeril 5mg  prn.  She had bilateral cataract removal in December and January.  No interval falls or illnesses.   Medications:  Current Outpatient Prescriptions on File Prior to Visit  Medication Sig Dispense Refill  . Cholecalciferol (VITAMIN D3) 5000 UNITS CAPS Take 5,000 Units by mouth daily.    . cyclobenzaprine (FLEXERIL) 10 MG tablet Take 0.5 tablets (5 mg total) by mouth at bedtime. 30 tablet 0  . HYDROcodone-acetaminophen (NORCO) 7.5-325 MG per tablet Take 1 tablet by mouth every 6 (six) hours as needed for moderate pain.    . Multiple Vitamin (MULTIVITAMIN) tablet Take 1 tablet by mouth daily.    . naproxen (NAPROSYN) 250 MG tablet Take by mouth 2 (two) times daily as needed.     Marland Kitchen buPROPion (WELLBUTRIN SR) 150 MG 12 hr tablet Take 1 tablet (150 mg total) by mouth 2 (two) times daily. (Patient not taking: Reported on 03/08/2017) 180 tablet 1   No current facility-administered medications on file prior to visit.     Allergies: No Known Allergies  Review of Systems:  CONSTITUTIONAL: No fevers, chills, night sweats, or weight loss.  EYES: No visual changes or eye pain ENT: No hearing changes.  No history of nose bleeds.   RESPIRATORY: No cough, wheezing and shortness of breath.   CARDIOVASCULAR: Negative for chest pain, and palpitations.   GI: Negative for abdominal discomfort, blood in stools or black stools.  No recent change in bowel habits.   GU:  No history of incontinence.   MUSCLOSKELETAL: No history of joint pain or swelling.  No myalgias.   SKIN: Negative for lesions, rash, and itching.   ENDOCRINE: Negative for cold or heat intolerance, polydipsia or goiter.   PSYCH:  No depression or anxiety symptoms.   NEURO: As Above.   Vital Signs:  BP 110/70   Pulse 84   Ht 5\' 4"  (1.626 m)   Wt 155 lb 2 oz (70.4 kg)   SpO2 97%   BMI 26.63 kg/m   Neurological Exam: MENTAL  STATUS including orientation to time, place, person, recent and remote memory, attention span and concentration, language, and fund of knowledge is normal.  Speech is not dysarthric.  CRANIAL NERVES: Fundoscopic exam is normal.  No visual field defects. Pupils equal round and reactive to light.  Normal conjugate, extra-ocular eye movements in all directions of gaze.  No ptosis. Normal facial sensation.  Face is symmetric. Palate elevates symmetrically.  Tongue is midline.  MOTOR:  Motor strength is 5/5 in all extremities.  No atrophy, fasciculations or abnormal movements.  No pronator drift.  Tone is normal. MSRs:  Right  Left brachioradialis 3+  brachioradialis 2+  biceps 3+  biceps 2+  triceps 3+  triceps 2+  patellar 3+  patellar 3+  ankle jerk 2+  ankle jerk 1+  Hoffman no  Hoffman no  plantar response down  plantar response down   SENSORY:  Vibration intact throughout.  Hyperesthesias to pin prick in the feet.  COORDINATION/GAIT:  Normal finger-to- nose-finger and heel-to-shin.  Intact rapid alternating movements bilaterally.  Gait narrow based and stable.  Tandem gait intact.   Data: MRI lumbar spine 12/12/2014: 1. No etiology for left L1 nerve root symptoms. Small discs in the thoracic spine at T8-T9 and T10-T11. 2. There is multilevel degenerative disc disease in the lower lumbar spine. On the left side the worse level is L5-S1 with narrowing of the left lateral recess and contact on the descending left S1 nerve root. 3. On the right side, there is stenosis of the right lateral recess at L4-L5 with probable impingement of the descending right L5 nerve root. 4. Mild canal stenosis at L3-L4 and L4-L5.  MRI brain 01/22/2015: Normal study. No evidence of demyelination.   MRI cervical spine 01/22/2015:  Multilevel degenerative spondylosis of the cervical spine. Posterior disc bulges at all levels, most marked at  C5-6 and C6-7 where there is tiny focus of cord signal change at C5-6. Multilevel neural foraminal narrowing.   MRI brain wwo contrast 01/29/2016:  Normal examination with the exception of a small focus of gliosis in the white matter of the left temporal tip. No cause of the presenting symptoms is identified.   MRI cervical and thoracic spine wwo contrast 01/29/2016: Abnormal T2 signal within the dorsal spinal cord at the T2 level. This could be due to demyelinating disease or myelitis. In correlation with the small ocus of abnormal white matter in the left temporal lobe, multiple sclerosis is possible.   Degenerative spondylosis from C2-3 through C6-7. No cord compression. Foraminal encroachment by osteophytes. There would be some potential to affect the C7 nerve roots based on foraminal narrowing.   Degenerative disc disease throughout the thoracic region as outlined above. Most of the abnormalities do not appear to significantly encroach upon the neural spaces or seem likely to be symptomatic other than possible associated a shin with back pain. At T8-9, there is a larger central disc herniation that effaces the ventral subarachnoid space and contacts the ventral aspect of the cord which could be symptomatic.  MRI brain, cervical, and thoracic spine wwo contrast 09/19/2016: Stable appearance of the brain compared to 01/29/2016, with few FLAIR hyperintensities in the cerebral white matter that are nonspecific. No interval or active demyelination.  1. No evidence of active cord demyelination; no convincing progression. A dorsal cord lesion at T2-3 is stable. Right lateral cord lesion centered at T6-7 is was not clearly seen on 01/29/2016 study but correlates with report of mid thoracic cord lesion on outside MRI in 2016. 2. Degenerative changes are stable and described above. 3. 25 mm right thyroid nodule. Sonographic follow-up is recommended at this size.  Labs 01/22/2015: CRP 1.1, TSH 0.56, CK  187   IMPRESSION/PLAN: Mrs. Altidor is a 56 year-old female returning for evaluation of a paraparetic illness due to thoracic cord demyelination involving the upper and midthoracic level in March 2016. Her symptoms vastly improved after receiving a course of methylprednisolone and she no longer has leg weakness, but continues to have hyperesthesia involving her mid back down.MRI thoracic spine from 2016 showed short segment of T2 hyperintensity over ~  T3 and T8 levels.   She had surveillance imaging of her brain, cervical, and thoracic cord March 2017 which shows a new left temporal tip hyperintensity. There was no active lesions.  With the presence of thoracic lesion and new silent left temporal lesion, the diagnosis of multiple sclerosis is most likely.  Disease modifying therapies were discussed, but since she has been clinically stable and she is apprehensive about potential side effects, and it was decided to follow her closely with imaging.   PLAN/RECOMMENDATIONS:  1.  DMT declined at this time.  If there are any new interval changes on MRI, low threshold to reconsider DMTs 2. Continue flexeril to 5mg  at bedtime for cramps  3.  Continue vitamin D 4000 units daily  Return to clinic in 6 months  The duration of this appointment visit was 20 minutes of face-to-face time with the patient.  Greater than 50% of this time was spent in counseling, explanation of diagnosis, planning of further management, and coordination of care.   Thank you for allowing me to participate in patient's care.  If I can answer any additional questions, I would be pleased to do so.    Sincerely,    Emmanuelle Coxe K. Allena Katz, DO

## 2017-03-08 NOTE — Patient Instructions (Signed)
Return to clinic 6 months

## 2017-06-19 ENCOUNTER — Other Ambulatory Visit: Payer: Self-pay | Admitting: Neurology

## 2017-06-20 ENCOUNTER — Other Ambulatory Visit: Payer: Self-pay | Admitting: *Deleted

## 2017-06-20 MED ORDER — CYCLOBENZAPRINE HCL 10 MG PO TABS: 5.0000 mg | ORAL_TABLET | Freq: Every day | ORAL | 3 refills | Status: DC

## 2017-07-25 DIAGNOSIS — H6982 Other specified disorders of Eustachian tube, left ear: Secondary | ICD-10-CM | POA: Diagnosis not present

## 2017-08-04 ENCOUNTER — Telehealth: Payer: Self-pay | Admitting: Neurology

## 2017-08-04 NOTE — Telephone Encounter (Signed)
Patient called and wants Regina Griffin to set up her scans with [facility] and to call her back.

## 2017-08-07 ENCOUNTER — Other Ambulatory Visit: Payer: Self-pay | Admitting: *Deleted

## 2017-08-07 DIAGNOSIS — M792 Neuralgia and neuritis, unspecified: Secondary | ICD-10-CM

## 2017-08-07 DIAGNOSIS — G373 Acute transverse myelitis in demyelinating disease of central nervous system: Secondary | ICD-10-CM

## 2017-08-07 DIAGNOSIS — M62838 Other muscle spasm: Secondary | ICD-10-CM

## 2017-08-07 DIAGNOSIS — G379 Demyelinating disease of central nervous system, unspecified: Secondary | ICD-10-CM

## 2017-08-07 NOTE — Telephone Encounter (Signed)
I spoke with patient and informed her that I sent in the orders for the scans.

## 2017-08-27 ENCOUNTER — Encounter: Payer: Self-pay | Admitting: Neurology

## 2017-08-30 ENCOUNTER — Inpatient Hospital Stay: Admission: RE | Admit: 2017-08-30 | Payer: BLUE CROSS/BLUE SHIELD | Source: Ambulatory Visit

## 2017-08-30 ENCOUNTER — Ambulatory Visit
Admission: RE | Admit: 2017-08-30 | Discharge: 2017-08-30 | Disposition: A | Discharge status: Home / Self Care | Payer: BLUE CROSS/BLUE SHIELD | Source: Ambulatory Visit | Attending: Neurology | Admitting: Neurology

## 2017-08-30 DIAGNOSIS — M62838 Other muscle spasm: Secondary | ICD-10-CM

## 2017-08-30 DIAGNOSIS — M47814 Spondylosis without myelopathy or radiculopathy, thoracic region: Secondary | ICD-10-CM | POA: Diagnosis not present

## 2017-08-30 DIAGNOSIS — M792 Neuralgia and neuritis, unspecified: Secondary | ICD-10-CM

## 2017-08-30 DIAGNOSIS — G373 Acute transverse myelitis in demyelinating disease of central nervous system: Secondary | ICD-10-CM

## 2017-08-30 DIAGNOSIS — C719 Malignant neoplasm of brain, unspecified: Secondary | ICD-10-CM | POA: Diagnosis not present

## 2017-08-30 DIAGNOSIS — G379 Demyelinating disease of central nervous system, unspecified: Secondary | ICD-10-CM

## 2017-08-30 MED ORDER — GADOBENATE DIMEGLUMINE 529 MG/ML IV SOLN
14.0000 mL | Freq: Once | INTRAVENOUS | Status: AC | PRN
  Administered 2017-08-30: 14 mL via INTRAVENOUS

## 2017-08-31 ENCOUNTER — Telehealth: Payer: Self-pay | Admitting: *Deleted

## 2017-08-31 ENCOUNTER — Encounter: Payer: Self-pay | Admitting: *Deleted

## 2017-08-31 NOTE — Telephone Encounter (Signed)
Message sent via MyChart.

## 2017-08-31 NOTE — Telephone Encounter (Signed)
-----   Message from Glendale Chard, DO sent at 08/31/2017  2:11 PM EDT ----- Please call and inform patient that her MRI thoracic spine is stable and does not show any new lesions, but there is a new lesion in the brain.  We will go over her images and discuss medications at her follow-up visit on 11/12. I will post her results on MyChart. Thanks.

## 2017-09-11 ENCOUNTER — Ambulatory Visit: Payer: BLUE CROSS/BLUE SHIELD | Admitting: Neurology

## 2017-09-11 ENCOUNTER — Other Ambulatory Visit (INDEPENDENT_AMBULATORY_CARE_PROVIDER_SITE_OTHER): Payer: BLUE CROSS/BLUE SHIELD

## 2017-09-11 ENCOUNTER — Encounter: Payer: Self-pay | Admitting: Neurology

## 2017-09-11 VITALS — BP 110/80 | HR 102 | Ht 64.0 in | Wt 158.0 lb

## 2017-09-11 DIAGNOSIS — G35 Multiple sclerosis: Secondary | ICD-10-CM | POA: Diagnosis not present

## 2017-09-11 LAB — CBC WITH DIFFERENTIAL/PLATELET
BASOS ABS: 0.1 10*3/uL (ref 0.0–0.1)
BASOS PCT: 0.9 % (ref 0.0–3.0)
Eosinophils Absolute: 0.1 10*3/uL (ref 0.0–0.7)
Eosinophils Relative: 2.1 % (ref 0.0–5.0)
HEMATOCRIT: 41.7 % (ref 36.0–46.0)
HEMOGLOBIN: 13.6 g/dL (ref 12.0–15.0)
LYMPHS PCT: 32.6 % (ref 12.0–46.0)
Lymphs Abs: 1.9 10*3/uL (ref 0.7–4.0)
MCHC: 32.6 g/dL (ref 30.0–36.0)
MCV: 97.4 fl (ref 78.0–100.0)
MONOS PCT: 7 % (ref 3.0–12.0)
Monocytes Absolute: 0.4 10*3/uL (ref 0.1–1.0)
NEUTROS ABS: 3.4 10*3/uL (ref 1.4–7.7)
Neutrophils Relative %: 57.4 % (ref 43.0–77.0)
PLATELETS: 239 10*3/uL (ref 150.0–400.0)
RBC: 4.28 Mil/uL (ref 3.87–5.11)
RDW: 13 % (ref 11.5–15.5)
WBC: 5.9 10*3/uL (ref 4.0–10.5)

## 2017-09-11 LAB — COMPREHENSIVE METABOLIC PANEL
ALT: 15 U/L (ref 0–35)
AST: 19 U/L (ref 0–37)
Albumin: 4.1 g/dL (ref 3.5–5.2)
Alkaline Phosphatase: 43 U/L (ref 39–117)
BILIRUBIN TOTAL: 1 mg/dL (ref 0.2–1.2)
BUN: 12 mg/dL (ref 6–23)
CALCIUM: 9.7 mg/dL (ref 8.4–10.5)
CHLORIDE: 107 meq/L (ref 96–112)
CO2: 28 meq/L (ref 19–32)
Creatinine, Ser: 0.89 mg/dL (ref 0.40–1.20)
GFR: 69.98 mL/min (ref >60.00)
Glucose, Bld: 96 mg/dL (ref 70–99)
Potassium: 4.3 mEq/L (ref 3.5–5.1)
Sodium: 142 mEq/L (ref 135–145)
Total Protein: 6.8 g/dL (ref 6.0–8.3)

## 2017-09-11 NOTE — Progress Notes (Signed)
Follow-up Visit   Date: 09/11/17    Regina Griffin MRN: [id number] DOB: 11/20/1960   Interim History: Regina Griffin is a 56 y.o. right-handed Caucasian female with chronic back pain and tobacco user returning to the clinic for follow-up of multiple sclerosis, manifesting with thoracic myelopathy (12/2014).  The patient was accompanied to the clinic by self.  History of present illness: She has been living in [place], [place] since December 2014 and moved to [place], [place] in July 2016. In February 2015, she starting having bilateral leg cramps and thought it was due her low back. In Janaury 2016, she developed tingling and numbness of the left foot, which again she attributed to her low back. Over two weeks, tingling started involving her thigh, abdomen, and up to her rib cage. She did accupuncture which helped her weakness and pain, but did not change her tingling. In March, she woke up with right foot numbness and over two days, progressed to involve her entire right side to her ribs. Eventually, she was unable to even stand due to weakness and she lost function of her right leg. She was admitted to hospital where imaging of her thoracic spine showed upper and mid thoracic T2 hyperintensity. MRI brain was normal. CSF testing showed positive oligoclonal bands. NMO antibody was negative. She completed methlyprednisolone 1g for 5 days followed by oral prednisone taper which improved her motor function where she was discharged with a walker. She was using a walker until April 2016. She had continued hypersensitivity of the legs.   She was being followed by Dr. Kelli Hopeonal Costigan, neurologist in [place], [place] and last evaluated in May 2016 at which time it was discussed whether this episode was an isolated event or the first presentation of multiple sclerosis and whether to start disease modifying therapy. Because she was clinically improving, the decision for expectant approach was  made and she was told to establish care [place] [facility].  She established care with me in September 2016.  For cramps and paresthesias, gabapentin and baclofen was started, but this made her too sleepy. She was switched to flexeril 5mg  qhs which helps.  She continues to have some tightness around her abdomin which has been present since her initial event due to her thoracic lesion, but nothing worse than before. Surveillance imaging in 2017 showed a new left temporal lesion.  She did not wish to start DMTs.  UPDATE 09/11/2017:  She is for 8410-month follow-up visit.  She is doing well and denies any new neurological complaints.  Her MRI brain from October shows a new left lateral ventricle lesion.  She is here today to discuss management options and has many questions and concerns.   Medications:  Current Outpatient Medications on File Prior to Visit  Medication Sig Dispense Refill  . buPROPion (WELLBUTRIN SR) 150 MG 12 hr tablet Take 1 tablet (150 mg total) by mouth 2 (two) times daily. 180 tablet 1  . Cholecalciferol (VITAMIN D3) 5000 UNITS CAPS Take 5,000 Units by mouth daily.    . cyclobenzaprine (FLEXERIL) 10 MG tablet Take 0.5 tablets (5 mg total) by mouth at bedtime. 30 tablet 3  . HYDROcodone-acetaminophen (NORCO) 7.5-325 MG per tablet Take 1 tablet by mouth every 6 (six) hours as needed for moderate pain.    . Multiple Vitamin (MULTIVITAMIN) tablet Take 1 tablet by mouth daily.    . naproxen (NAPROSYN) 250 MG tablet Take by mouth 2 (two) times daily as needed.  No current facility-administered medications on file prior to visit.     Allergies: No Known Allergies  Review of Systems:  CONSTITUTIONAL: No fevers, chills, night sweats, or weight loss.  EYES: No visual changes or eye pain ENT: No hearing changes.  No history of nose bleeds.   RESPIRATORY: No cough, wheezing and shortness of breath.   CARDIOVASCULAR: Negative for chest pain, and palpitations.   GI: Negative for  abdominal discomfort, blood in stools or black stools.  No recent change in bowel habits.   GU:  No history of incontinence.   MUSCLOSKELETAL: No history of joint pain or swelling.  No myalgias.   SKIN: Negative for lesions, rash, and itching.   ENDOCRINE: Negative for cold or heat intolerance, polydipsia or goiter.   PSYCH:  No depression or anxiety symptoms.   NEURO: As Above.   Vital Signs:  BP 110/80   Pulse (!) 102   Ht 5\' 4"  (1.626 m)   Wt 158 lb (71.7 kg)   SpO2 99%   BMI 27.12 kg/m   General:  Well appearing, comfortable. She is tearful today due to new lesions on her MRI.   Neurological Exam: MENTAL STATUS including orientation to time, place, person, recent and remote memory, attention span and concentration, language, and fund of knowledge is normal.  Speech is not dysarthric.  CRANIAL NERVES:  No visual field defects. Pupils equal round and reactive to light.  Normal conjugate, extra-ocular eye movements in all directions of gaze.  No ptosis. Normal facial sensation.  Face is symmetric. Palate elevates symmetrically.  Tongue is midline.  MOTOR:  Motor strength is 5/5 in all extremities.    No pronator drift.  Tone is normal.  MSRs:  Right                                                                 Left brachioradialis 3+  brachioradialis 2+  biceps 3+  biceps 2+  triceps 3+  triceps 2+  patellar 3+  patellar 3+  ankle jerk 2+  ankle jerk 1+  Hoffman no  Hoffman no  plantar response down  plantar response down   SENSORY:  Vibration intact throughout.    COORDINATION/GAIT:  Normal finger-to- nose-finger.  Intact rapid alternating movements bilaterally.  Gait narrow based and stable.  Data: MRI lumbar spine 12/12/2014: 1. No etiology for left L1 nerve root symptoms. Small discs in the thoracic spine at T8-T9 and T10-T11. 2. There is multilevel degenerative disc disease in the lower lumbar spine. On the left side the worse level is L5-S1 with narrowing of  the left lateral recess and contact on the descending left S1 nerve root. 3. On the right side, there is stenosis of the right lateral recess at L4-L5 with probable impingement of the descending right L5 nerve root. 4. Mild canal stenosis at L3-L4 and L4-L5.  MRI brain 01/22/2015: Normal study. No evidence of demyelination.   MRI cervical spine 01/22/2015:  Multilevel degenerative spondylosis of the cervical spine. Posterior disc bulges at all levels, most marked at C5-6 and C6-7 where there is tiny focus of cord signal change at C5-6. Multilevel neural foraminal narrowing.   MRI brain wwo contrast 01/29/2016:  Normal examination with the exception of a small focus of gliosis  in the white matter of the left temporal tip. No cause of the presenting symptoms is identified.  MRI cervical and thoracic spine wwo contrast 01/29/2016: Abnormal T2 signal within the dorsal spinal cord at the T2 level. This could be due to demyelinating disease or myelitis. In correlation with the small ocus of abnormal white matter in the left temporal lobe, multiple sclerosis is possible.   Degenerative spondylosis from C2-3 through C6-7. No cord compression. Foraminal encroachment by osteophytes. There would be some potential to affect the C7 nerve roots based on foraminal narrowing.   Degenerative disc disease throughout the thoracic region as outlined above. Most of the abnormalities do not appear to significantly encroach upon the neural spaces or seem likely to be symptomatic other than possible associated a shin with back pain. At T8-9, there is a larger central disc herniation that effaces the ventral subarachnoid space and contacts the ventral aspect of the cord which could be symptomatic.  MRI brain, cervical, and thoracic spine wwo contrast 09/19/2016: Stable appearance of the brain compared to 01/29/2016, with few FLAIR hyperintensities in the cerebral white matter that are nonspecific. No interval or  active demyelination. 1. No evidence of active cord demyelination; no convincing progression. A dorsal cord lesion at T2-3 is stable. Right lateral cord lesion centered at T6-7 is was not clearly seen on 01/29/2016 study but correlates with report of mid thoracic cord lesion on outside MRI in 2016. 2. Degenerative changes are stable and described above. 3. 25 mm right thyroid nodule. Sonographic follow-up is recommended at this size.  MRI brain wwo contrast 08/30/2017: New 9 mm focus of abnormal T2 and FLAIR signal within the deep white matter adjacent to the atrium of the left lateral ventricle consistent with a demyelinating lesion. No restricted diffusion or contrast enhancement.  No change in left temporal tip white matter lesion in tiny punctate frontal lobe white matter foci.  MRI thoracic spine wwo contrast 08/30/2017: No change since previous studies. Low level abnormal signal within the dorsal cord at T2-3, on the right at T6-7 and bilateral at T8 all appear stable. No progression or contrast enhancement.   Chronic degenerative changes of the spine appear stable. Most prominent finding is a central disc herniation at T8-9 that contacts but does not grossly compress the cord.  Labs 01/22/2015: CRP 1.1, TSH 0.56, CK 187   IMPRESSION/PLAN: Mrs. Spranger is a 56 year-old female returning for follow-up of relapsing remitting multiple sclerosis.  She initially had paraparetic illness due to thoracic cord demyelination involving the upper and midthoracic level in March 2016. Her symptoms improved after receiving a course of methylprednisolone and she no longer has leg weakness, but continues to have hyperesthesia involving her mid back down.MRI thoracic spine from 2016 showed short segment of T2 hyperintensity over ~ T3 and T8 levels.   She had surveillance imaging of her brain, cervical, and thoracic cord March 2017 which shows a new left temporal tip hyperintensity.  MRI brain in  October 2018 showed new left lateral ventricle lesion.  There is clear evidence of disease activity and progression and I stressed the importance of started disease modifying therapies to minimize progression and neurological functional impairment.  Understandably, she is very distraught at the diagnosis of MS and seeing that there are new lesions.  She is not keen on starting DMTs due to potential side effects.  Again, I discussed disease modifying therapies at length and recommend that we start an agent.  She has researched Plegrity, Copaxone,  Gilenya and Tecifdera and had many questions which were answered to the best of my ability. She is not ready to commit to any medication and would like to take time to think about her options.  She is aware that not being on DMTs leaves her at greater risk of disease burden.    PLAN/RECOMMENDATIONS:  1.  Check CBC and CMP for baseline labs  2.  DMTs declined at this time.   3. Continue flexeril to 5mg  at bedtime for cramps  4.  Continue vitamin D 4000 units daily  Return to clinic in 6 months  Greater than 50% of this 40 minute visit was spent in counseling, explanation of diagnosis, planning of further management, and coordination of care due to the number of questions and concerns about disease modifying therapies.     Thank you for allowing me to participate in patient's care.  If I can answer any additional questions, I would be pleased to do so.    Sincerely,    Hildreth Robart K. Allena Katz, DO

## 2017-09-11 NOTE — Patient Instructions (Signed)
1.  Check labs 2.  Please let me know if you would like to start a medication for multiple sclerosis  Return to clinic in 6 months

## 2017-10-06 ENCOUNTER — Other Ambulatory Visit: Payer: BLUE CROSS/BLUE SHIELD

## 2017-10-11 ENCOUNTER — Other Ambulatory Visit: Payer: BLUE CROSS/BLUE SHIELD

## 2017-10-11 ENCOUNTER — Encounter: Payer: BLUE CROSS/BLUE SHIELD | Admitting: Physician Assistant

## 2017-10-12 ENCOUNTER — Encounter: Payer: Self-pay | Admitting: Physician Assistant

## 2017-10-12 ENCOUNTER — Other Ambulatory Visit: Payer: Self-pay

## 2017-10-12 ENCOUNTER — Other Ambulatory Visit: Payer: BLUE CROSS/BLUE SHIELD

## 2017-10-12 ENCOUNTER — Ambulatory Visit (INDEPENDENT_AMBULATORY_CARE_PROVIDER_SITE_OTHER): Payer: BLUE CROSS/BLUE SHIELD | Admitting: Physician Assistant

## 2017-10-12 VITALS — BP 100/60 | HR 76 | Temp 98.2°F | Resp 14 | Ht 64.0 in | Wt 159.0 lb

## 2017-10-12 DIAGNOSIS — G35 Multiple sclerosis: Secondary | ICD-10-CM | POA: Diagnosis not present

## 2017-10-12 DIAGNOSIS — Z1231 Encounter for screening mammogram for malignant neoplasm of breast: Secondary | ICD-10-CM

## 2017-10-12 DIAGNOSIS — Z87891 Personal history of nicotine dependence: Secondary | ICD-10-CM | POA: Diagnosis not present

## 2017-10-12 DIAGNOSIS — Z0000 Encounter for general adult medical examination without abnormal findings: Secondary | ICD-10-CM | POA: Diagnosis not present

## 2017-10-12 DIAGNOSIS — Z23 Encounter for immunization: Secondary | ICD-10-CM | POA: Diagnosis not present

## 2017-10-12 DIAGNOSIS — Z1239 Encounter for other screening for malignant neoplasm of breast: Secondary | ICD-10-CM

## 2017-10-12 LAB — LIPID PANEL
CHOLESTEROL: 166 mg/dL (ref 0–200)
HDL: 72.2 mg/dL (ref >39.00)
LDL Cholesterol: 82 mg/dL (ref 0–99)
NonHDL: 93.85
TRIGLYCERIDES: 57 mg/dL (ref 0.0–149.0)
Total CHOL/HDL Ratio: 2
VLDL: 11.4 mg/dL (ref 0.0–40.0)

## 2017-10-12 NOTE — Assessment & Plan Note (Signed)
Followed by Neurology, Dr. Allena Katz. Will update immunizations today prior to patient starting immunosupresant.

## 2017-10-12 NOTE — Assessment & Plan Note (Signed)
Depression screen negative. Health Maintenance reviewed -- Order for screening mammogram placed. Preventive schedule discussed and handout given in AVS. Will obtain fasting labs today.

## 2017-10-12 NOTE — Progress Notes (Signed)
Patient presents to clinic today for annual exam.  Patient is fasting for labs.  Chronic Issues: Multiple Sclerosis -- Patient followed by Neurology (Dr. Allena Katz). Had recent appointment on 09/11/17. Recent MRI revealed new MS lesion. It was recommended that she be started on immunotherapy.  She states she was instructed to see PCP about updating her flu, pneumonia and to get a shingles vaccine prior to starting.  Tobacco Use -- Patient endorses cessation over 1 month ago. States she is doing very well. Denies cravings. Has noted a slight weight gain since stopping but is trying to stay active and watch diet. Is very happy that she has been able to stop.   Health Maintenance: Immunizations -- Due for flu shot. Also requesting pneumonia and Shingles vaccines before starting immunosuppressant therapy for MS with Neurology.  Colonoscopy -- up-to-date. Mammogram -- Due for repeat. Order placed. PAP -- up-to-date  Past Medical History:  Diagnosis Date  . Colon polyps    Benign  . History of chicken pox   . HPV in female   . Shingles   . Thyroid disease    Nodules 2014, benign  . UTI (lower urinary tract infection)     Past Surgical History:  Procedure Laterality Date  . CESAREAN SECTION     1996  . CHOLECYSTECTOMY     2012  . WISDOM TOOTH EXTRACTION      Current Outpatient Medications on File Prior to Visit  Medication Sig Dispense Refill  . buPROPion (WELLBUTRIN SR) 150 MG 12 hr tablet Take 1 tablet (150 mg total) by mouth 2 (two) times daily. 180 tablet 1  . Cholecalciferol (VITAMIN D3) 5000 UNITS CAPS Take 5,000 Units by mouth daily.    . cyclobenzaprine (FLEXERIL) 10 MG tablet Take 0.5 tablets (5 mg total) by mouth at bedtime. 30 tablet 3  . HYDROcodone-acetaminophen (NORCO) 7.5-325 MG per tablet Take 1 tablet by mouth every 6 (six) hours as needed for moderate pain.    . Multiple Vitamin (MULTIVITAMIN) tablet Take 1 tablet by mouth daily.    . naproxen (NAPROSYN) 250 MG  tablet Take by mouth 2 (two) times daily as needed.     . Naproxen Sod-Diphenhydramine (ALEVE PM) 220-25 MG TABS Take 1 tablet by mouth at bedtime.     No current facility-administered medications on file prior to visit.     No Known Allergies  Family History  Problem Relation Age of Onset  . Lung cancer Mother 21       Deceased  . Prostate cancer Father 44       Deceased  . Pancreatic cancer Paternal Uncle   . Healthy Brother        x3  . Healthy Sister        x2  . Menstrual problems Daughter        x1    Social History   Socioeconomic History  . Marital status: Married    Spouse name: Not on file  . Number of children: 1  . Years of education: Not on file  . Highest education level: Not on file  Social Needs  . Financial resource strain: Not on file  . Food insecurity - worry: Not on file  . Food insecurity - inability: Not on file  . Transportation needs - medical: Not on file  . Transportation needs - non-medical: Not on file  Occupational History  . Not on file  Tobacco Use  . Smoking status: Former Smoker  Packs/day: 0.50    Years: 25.00    Pack years: 12.50    Last attempt to quit: 07/31/2017    Years since quitting: 0.2  . Smokeless tobacco: Never Used  Substance and Sexual Activity  . Alcohol use: Yes    Alcohol/week: 1.2 - 1.8 oz    Types: 2 - 3 Standard drinks or equivalent per week    Comment: social  . Drug use: No  . Sexual activity: Yes  Other Topics Concern  . Not on file  Social History Narrative   Lives with husband in a 2 story home.  Has one daughter. [place] in [place] back to the [place]  Self employed.  Education: associates degree.   Review of Systems  Constitutional: Negative for fever and weight loss.  HENT: Negative for ear discharge, ear pain, hearing loss and tinnitus.   Eyes: Negative for blurred vision, double vision, photophobia and pain.  Respiratory: Negative for cough and shortness of breath.     Cardiovascular: Negative for chest pain and palpitations.  Gastrointestinal: Negative for abdominal pain, blood in stool, constipation, diarrhea, heartburn, melena, nausea and vomiting.  Genitourinary: Negative for dysuria, flank pain, frequency, hematuria and urgency.  Musculoskeletal: Negative for falls.  Neurological: Negative for dizziness, loss of consciousness and headaches.  Endo/Heme/Allergies: Negative for environmental allergies.  Psychiatric/Behavioral: Negative for depression, hallucinations, substance abuse and suicidal ideas. The patient is not nervous/anxious and does not have insomnia.    BP 100/60   Pulse 76   Temp 98.2 F (36.8 C) (Oral)   Resp 14   Ht 5\' 4"  (1.626 m)   Wt 159 lb (72.1 kg)   SpO2 98%   BMI 27.29 kg/m   Physical Exam  Constitutional: She is oriented to person, place, and time and well-developed, well-nourished, and in no distress.  HENT:  Head: Normocephalic and atraumatic.  Right Ear: Tympanic membrane, external ear and ear canal normal.  Left Ear: Tympanic membrane, external ear and ear canal normal.  Nose: Nose normal. No mucosal edema.  Mouth/Throat: Uvula is midline, oropharynx is clear and moist and mucous membranes are normal. No oropharyngeal exudate or posterior oropharyngeal erythema.  Eyes: Conjunctivae are normal. Pupils are equal, round, and reactive to light.  Neck: Neck supple. No thyromegaly present.  Cardiovascular: Normal rate, regular rhythm, normal heart sounds and intact distal pulses.  Pulmonary/Chest: Effort normal and breath sounds normal. No respiratory distress. She has no wheezes. She has no rales.  Abdominal: Soft. Bowel sounds are normal. She exhibits no distension and no mass. There is no tenderness. There is no rebound and no guarding.  Lymphadenopathy:    She has no cervical adenopathy.  Neurological: She is alert and oriented to person, place, and time. No cranial nerve deficit.  Skin: Skin is warm and dry. No  rash noted.  Psychiatric: Affect normal.  Vitals reviewed.   Recent Results (from the past 2160 hour(s))  Comprehensive metabolic panel     Status: None   Collection Time: 09/11/17 10:49 AM  Result Value Ref Range   Sodium 142 135 - 145 mEq/L   Potassium 4.3 3.5 - 5.1 mEq/L   Chloride 107 96 - 112 mEq/L   CO2 28 19 - 32 mEq/L   Glucose, Bld 96 70 - 99 mg/dL   BUN 12 6 - 23 mg/dL   Creatinine, Ser 4.690.89 0.40 - 1.20 mg/dL   Total Bilirubin 1.0 0.2 - 1.2 mg/dL   Alkaline Phosphatase 43 39 - 117  U/L   AST 19 0 - 37 U/L   ALT 15 0 - 35 U/L   Total Protein 6.8 6.0 - 8.3 g/dL   Albumin 4.1 3.5 - 5.2 g/dL   Calcium 9.7 8.4 - 10.2 mg/dL   GFR 72.53 >66.44 mL/min  CBC with Differential/Platelet     Status: None   Collection Time: 09/11/17 10:49 AM  Result Value Ref Range   WBC 5.9 4.0 - 10.5 K/uL   RBC 4.28 3.87 - 5.11 Mil/uL   Hemoglobin 13.6 12.0 - 15.0 g/dL   HCT 03.4 74.2 - 59.5 %   MCV 97.4 78.0 - 100.0 fl   MCHC 32.6 30.0 - 36.0 g/dL   RDW 63.8 75.6 - 43.3 %   Platelets 239.0 150.0 - 400.0 K/uL   Neutrophils Relative % 57.4 43.0 - 77.0 %   Lymphocytes Relative 32.6 12.0 - 46.0 %   Monocytes Relative 7.0 3.0 - 12.0 %   Eosinophils Relative 2.1 0.0 - 5.0 %   Basophils Relative 0.9 0.0 - 3.0 %   Neutro Abs 3.4 1.4 - 7.7 K/uL   Lymphs Abs 1.9 0.7 - 4.0 K/uL   Monocytes Absolute 0.4 0.1 - 1.0 K/uL   Eosinophils Absolute 0.1 0.0 - 0.7 K/uL   Basophils Absolute 0.1 0.0 - 0.1 K/uL  Lipid Profile     Status: None   Collection Time: 10/12/17 10:36 AM  Result Value Ref Range   Cholesterol 166 0 - 200 mg/dL    Comment: ATP III Classification       Desirable:  < 200 mg/dL               Borderline High:  200 - 239 mg/dL          High:  > = 295 mg/dL   Triglycerides 18.8 0.0 - 149.0 mg/dL    Comment: Normal:  <416 mg/dLBorderline High:  150 - 199 mg/dL   HDL 60.63 >01.60 mg/dL   VLDL 10.9 0.0 - 32.3 mg/dL   LDL Cholesterol 82 0 - 99 mg/dL   Total CHOL/HDL Ratio 2     Comment:                 Men          Women1/2 Average Risk     3.4          3.3Average Risk          5.0          4.42X Average Risk          9.6          7.13X Average Risk          15.0          11.0                       NonHDL 93.85     Comment: NOTE:  Non-HDL goal should be 30 mg/dL higher than patient's LDL goal (i.e. LDL goal of < 70 mg/dL, would have non-HDL goal of < 100 mg/dL)    Assessment/Plan: Stopped smoking Patient has stopped with the use of Wellbutrin. Will monitor.  Visit for preventive health examination Depression screen negative. Health Maintenance reviewed -- Order for screening mammogram placed. Preventive schedule discussed and handout given in AVS. Will obtain fasting labs today.    Multiple sclerosis (HCC) Followed by Neurology, Dr. Allena Katz. Will update immunizations today prior to patient starting immunosupresant.  Leeanne Rio, PA-C

## 2017-10-12 NOTE — Assessment & Plan Note (Signed)
Patient has stopped with the use of Wellbutrin. Will monitor.

## 2017-10-12 NOTE — Patient Instructions (Signed)
Please go to the lab for blood work.   Our office will call you with your results unless you have chosen to receive results via MyChart.  If your blood work is normal we will follow-up each year for physicals and as scheduled for chronic medical problems.  If anything is abnormal we will treat accordingly and get you in for a follow-up.  You are up-to-date on flu shot (repeat yearly), pneumonia vaccine (next at 65) and 1st dose of the shingles vaccine (repeat in 2-6 months).       Preventive Care 40-64 Years, Female Preventive care refers to lifestyle choices and visits with your health care provider that can promote health and wellness. What does preventive care include?  A yearly physical exam. This is also called an annual well check.  Dental exams once or twice a year.  Routine eye exams. Ask your health care provider how often you should have your eyes checked.  Personal lifestyle choices, including: ? Daily care of your teeth and gums. ? Regular physical activity. ? Eating a healthy diet. ? Avoiding tobacco and drug use. ? Limiting alcohol use. ? Practicing safe sex. ? Taking low-dose aspirin daily starting at age 81. ? Taking vitamin and mineral supplements as recommended by your health care provider. What happens during an annual well check? The services and screenings done by your health care provider during your annual well check will depend on your age, overall health, lifestyle risk factors, and family history of disease. Counseling Your health care provider may ask you questions about your:  Alcohol use.  Tobacco use.  Drug use.  Emotional well-being.  Home and relationship well-being.  Sexual activity.  Eating habits.  Work and work Statistician.  Method of birth control.  Menstrual cycle.  Pregnancy history.  Screening You may have the following tests or measurements:  Height, weight, and BMI.  Blood pressure.  Lipid and cholesterol  levels. These may be checked every 5 years, or more frequently if you are over 19 years old.  Skin check.  Lung cancer screening. You may have this screening every year starting at age 75 if you have a 30-pack-year history of smoking and currently smoke or have quit within the past 15 years.  Fecal occult blood test (FOBT) of the stool. You may have this test every year starting at age 76.  Flexible sigmoidoscopy or colonoscopy. You may have a sigmoidoscopy every 5 years or a colonoscopy every 10 years starting at age 73.  Hepatitis C blood test.  Hepatitis B blood test.  Sexually transmitted disease (STD) testing.  Diabetes screening. This is done by checking your blood sugar (glucose) after you have not eaten for a while (fasting). You may have this done every 1-3 years.  Mammogram. This may be done every 1-2 years. Talk to your health care provider about when you should start having regular mammograms. This may depend on whether you have a family history of breast cancer.  BRCA-related cancer screening. This may be done if you have a family history of breast, ovarian, tubal, or peritoneal cancers.  Pelvic exam and Pap test. This may be done every 3 years starting at age 42. Starting at age 30, this may be done every 5 years if you have a Pap test in combination with an HPV test.  Bone density scan. This is done to screen for osteoporosis. You may have this scan if you are at high risk for osteoporosis.  Discuss your test results, treatment  options, and if necessary, the need for more tests with your health care provider. Vaccines Your health care provider may recommend certain vaccines, such as:  Influenza vaccine. This is recommended every year.  Tetanus, diphtheria, and acellular pertussis (Tdap, Td) vaccine. You may need a Td booster every 10 years.  Varicella vaccine. You may need this if you have not been vaccinated.  Zoster vaccine. You may need this after age  47.  Measles, mumps, and rubella (MMR) vaccine. You may need at least one dose of MMR if you were born in 1957 or later. You may also need a second dose.  Pneumococcal 13-valent conjugate (PCV13) vaccine. You may need this if you have certain conditions and were not previously vaccinated.  Pneumococcal polysaccharide (PPSV23) vaccine. You may need one or two doses if you smoke cigarettes or if you have certain conditions.  Meningococcal vaccine. You may need this if you have certain conditions.  Hepatitis A vaccine. You may need this if you have certain conditions or if you travel or work in places where you may be exposed to hepatitis A.  Hepatitis B vaccine. You may need this if you have certain conditions or if you travel or work in places where you may be exposed to hepatitis B.  Haemophilus influenzae type b (Hib) vaccine. You may need this if you have certain conditions.  Talk to your health care provider about which screenings and vaccines you need and how often you need them. This information is not intended to replace advice given to you by your health care provider. Make sure you discuss any questions you have with your health care provider. Document Released: 11/13/2015 Document Revised: 07/06/2016 Document Reviewed: 08/18/2015 Elsevier Interactive Patient Education  2017 [employer].

## 2017-10-13 ENCOUNTER — Encounter: Payer: Self-pay | Admitting: Emergency Medicine

## 2017-11-07 DIAGNOSIS — Z1231 Encounter for screening mammogram for malignant neoplasm of breast: Secondary | ICD-10-CM | POA: Diagnosis not present

## 2017-11-07 LAB — HM MAMMOGRAPHY

## 2017-11-15 ENCOUNTER — Encounter: Payer: Self-pay | Admitting: Physician Assistant

## 2018-03-05 ENCOUNTER — Encounter: Payer: Self-pay | Admitting: Physician Assistant

## 2018-03-06 MED ORDER — BUPROPION HCL ER (SR) 150 MG PO TB12: 150.0000 mg | ORAL_TABLET | Freq: Two times a day (BID) | ORAL | 1 refills | Status: DC

## 2018-03-12 ENCOUNTER — Encounter: Payer: Self-pay | Admitting: Neurology

## 2018-03-12 ENCOUNTER — Ambulatory Visit: Payer: BLUE CROSS/BLUE SHIELD | Admitting: Neurology

## 2018-03-12 VITALS — BP 110/84 | HR 91 | Ht 64.0 in | Wt 163.4 lb

## 2018-03-12 DIAGNOSIS — M792 Neuralgia and neuritis, unspecified: Secondary | ICD-10-CM | POA: Diagnosis not present

## 2018-03-12 DIAGNOSIS — G35 Multiple sclerosis: Secondary | ICD-10-CM | POA: Diagnosis not present

## 2018-03-12 MED ORDER — GABAPENTIN 300 MG PO CAPS: ORAL_CAPSULE | ORAL | 5 refills | Status: DC

## 2018-03-12 NOTE — Patient Instructions (Signed)
Start gabapentin 300mg  at bedtime x 2 weeks, then increase to 1 tablet twice daily  If you develop any new neurological concerns, please call my office for sooner appointment  Return to clinic in 4 months

## 2018-03-12 NOTE — Progress Notes (Signed)
Follow-up Visit   Date: 03/12/18    Regina Griffin MRN: [id number] DOB: December 08, 1960   Interim History: Regina Griffin is a 57 y.o. right-handed Caucasian female with chronic back pain and tobacco user returning to the clinic for follow-up of multiple sclerosis, manifesting with thoracic myelopathy (12/2014).  The patient was accompanied to the clinic by self.  History of present illness: She has been living in [place], [place] since December 2014 and moved to [place], [place] in July 2016. In February 2015, she starting having bilateral leg cramps and thought it was due her low back. In Janaury 2016, she developed tingling and numbness of the left foot, which again she attributed to her low back. Over two weeks, tingling started involving her thigh, abdomen, and up to her rib cage.In March, she woke up with right foot numbness and over two days, progressed to involve her entire right side to her ribs. Eventually, she was unable to even stand due to weakness and she lost function of her right leg. She was admitted to hospital where imaging of her thoracic spine showed upper and mid thoracic T2 hyperintensity. MRI brain was normal. CSF testing showed positive oligoclonal bands. NMO antibody was negative. She completed methlyprednisolone 1g for 5 days followed by oral prednisone taper which improved her motor function where she was discharged with a walker. She was using a walker until April 2016. She had continued hypersensitivity of the legs.   She was being followed by Dr. Kelli Hope, neurologist in [place], [place] and last evaluated in May 2016 at which time it was discussed whether this episode was an isolated event or the first presentation of multiple sclerosis and whether to start disease modifying therapy. Because she was clinically improving, the decision for expectant approach was made and she was told to establish care once she moved to [place].  She established care with me  in September 2016.  For cramps and paresthesias, gabapentin and baclofen was started, but this made her too sleepy. She was switched to flexeril 5mg  qhs which helps.  She continues to have some tightness around her abdomin which has been present since her initial event due to her thoracic lesion, but nothing worse than before. Surveillance imaging in 2017 showed a new left temporal lesion.  She did not wish to start DMTs.  UPDATE 03/12/2018:  She is here for 6 month visit.  Around December, she began having increased intensity of numbness involving her left leg, which was previously present but more noticeable.  She says that the intensity is about 20% more than before.  She has increase sensitivity of the skin.  She denies any vision changes, paresthesias of the arm/face, weakness, or falls. She admits to have new life stressor and is going through a divorce, so had to find a new job and will be changing insurance carriers. She is interested in starting Tecfidera, but would like to wait until she is on her new insurance plan.  Medications:  Current Outpatient Medications on File Prior to Visit  Medication Sig Dispense Refill  . buPROPion (WELLBUTRIN SR) 150 MG 12 hr tablet Take 1 tablet (150 mg total) by mouth 2 (two) times daily. 180 tablet 1  . Cholecalciferol (VITAMIN D3) 5000 UNITS CAPS Take 5,000 Units by mouth daily.    . cyclobenzaprine (FLEXERIL) 10 MG tablet Take 0.5 tablets (5 mg total) by mouth at bedtime. 30 tablet 3  . HYDROcodone-acetaminophen (NORCO) 7.5-325 MG per tablet Take 1 tablet by mouth every  6 (six) hours as needed for moderate pain.    . Multiple Vitamin (MULTIVITAMIN) tablet Take 1 tablet by mouth daily.    . naproxen (NAPROSYN) 250 MG tablet Take by mouth 2 (two) times daily as needed.     . Naproxen Sod-Diphenhydramine (ALEVE PM) 220-25 MG TABS Take 1 tablet by mouth at bedtime.     No current facility-administered medications on file prior to visit.     Allergies: No  Known Allergies  Review of Systems:  CONSTITUTIONAL: No fevers, chills, night sweats, or weight loss.  EYES: No visual changes or eye pain ENT: No hearing changes.  No history of nose bleeds.   RESPIRATORY: No cough, wheezing and shortness of breath.   CARDIOVASCULAR: Negative for chest pain, and palpitations.   GI: Negative for abdominal discomfort, blood in stools or black stools.  No recent change in bowel habits.   GU:  No history of incontinence.   MUSCLOSKELETAL: No history of joint pain or swelling.  No myalgias.   SKIN: Negative for lesions, rash, and itching.   ENDOCRINE: Negative for cold or heat intolerance, polydipsia or goiter.   PSYCH:  No depression or anxiety symptoms.   NEURO: As Above.   Vital Signs:  BP 110/84   Pulse 91   Ht 5\' 4"  (1.626 m)   Wt 163 lb 6 oz (74.1 kg)   SpO2 97%   BMI 28.04 kg/m   General Medical Exam:   General:  Well appearing, comfortable  Neck: No carotid bruits. Respiratory:  Clear to auscultation, good air entry bilaterally.   Cardiac:  Regular rate and rhythm, no murmur.   Ext;  No edema  Neurological Exam: MENTAL STATUS including orientation to time, place, person, recent and remote memory, attention span and concentration, language, and fund of knowledge is normal.  Speech is not dysarthric.  CRANIAL NERVES:  No visual field defects. Pupils equal round and reactive to light.  Normal conjugate, extra-ocular eye movements in all directions of gaze.  No ptosis. Normal facial sensation.  Face is symmetric. Palate elevates symmetrically.  Tongue is midline.  MOTOR:  Motor strength is 5/5 in all extremities.    No pronator drift.  Tone is normal.  MSRs:  Right                                                                 Left brachioradialis 3+  brachioradialis 2+  biceps 3+  biceps 2+  triceps 3+  triceps 2+  patellar 3+  patellar 3+  ankle jerk 2+  ankle jerk 1+  Hoffman no  Hoffman no   SENSORY:  Vibration, temperature, and  pin prick is mildly heightening on the LLE, as compared to the right.     COORDINATION/GAIT:  Normal finger-to- nose-finger.  Intact rapid alternating movements bilaterally.  Gait narrow based and stable.  Data: MRI lumbar spine 12/12/2014: 1. No etiology for left L1 nerve root symptoms. Small discs in the thoracic spine at T8-T9 and T10-T11. 2. There is multilevel degenerative disc disease in the lower lumbar spine. On the left side the worse level is L5-S1 with narrowing of the left lateral recess and contact on the descending left S1 nerve root. 3. On the right side, there is stenosis of the  right lateral recess at L4-L5 with probable impingement of the descending right L5 nerve root. 4. Mild canal stenosis at L3-L4 and L4-L5.  MRI brain 01/22/2015: Normal study. No evidence of demyelination.   MRI cervical spine 01/22/2015:  Multilevel degenerative spondylosis of the cervical spine. Posterior disc bulges at all levels, most marked at C5-6 and C6-7 where there is tiny focus of cord signal change at C5-6. Multilevel neural foraminal narrowing.   MRI brain wwo contrast 01/29/2016:  Normal examination with the exception of a small focus of gliosis in the white matter of the left temporal tip. No cause of the presenting symptoms is identified.  MRI cervical and thoracic spine wwo contrast 01/29/2016:  Abnormal T2 signal within the dorsal spinal cord at the T2 level.  This could be due to demyelinating disease or myelitis. In correlation with the small ocus of abnormal white matter in the left temporal lobe, multiple sclerosis is possible.  Degenerative spondylosis from C2-3 through C6-7. No cord compression. Foraminal encroachment by osteophytes. There would be some potential to affect the C7 nerve roots based on foraminal narrowing.   Degenerative disc disease throughout the thoracic region as outlined above. Most of the abnormalities do not appear to significantly encroach upon the neural  spaces or seem likely to be symptomatic other than possible associated a shin with back pain. At T8-9, there is a larger central disc herniation that effaces the ventral subarachnoid space and contacts the ventral aspect of the cord which could be symptomatic.  MRI brain, cervical, and thoracic spine wwo contrast 09/19/2016: Stable appearance of the brain compared to 01/29/2016, with few FLAIR hyperintensities in the cerebral white matter that are nonspecific. No interval or active demyelination. 1. No evidence of active cord demyelination; no convincing progression. A dorsal cord lesion at T2-3 is stable. Right lateral cord lesion centered at T6-7 is was not clearly seen on 01/29/2016 study but correlates with report of mid thoracic cord lesion on outside MRI in 2016. 2. Degenerative changes are stable and described above. 3. 25 mm right thyroid nodule. Sonographic follow-up is recommended at this size.  MRI brain wwo contrast 08/30/2017:  New 9 mm focus of abnormal T2 and FLAIR signal within the deep white matter adjacent to the atrium of the left lateral ventricle consistent with a demyelinating lesion. No restricted diffusion or contrast enhancement.  No change in left temporal tip white matter lesion in tiny punctate frontal lobe white matter foci.  MRI thoracic spine wwo contrast 08/30/2017: No change since previous studies. Low level abnormal signal within the dorsal cord at T2-3, on the right at T6-7 and bilateral at T8 all appear stable. No progression or contrast enhancement.   Chronic degenerative changes of the spine appear stable. Most prominent finding is a central disc herniation at T8-9 that contacts but does not grossly compress the cord.  Labs 01/22/2015: CRP 1.1, TSH 0.56, CK 187   IMPRESSION/PLAN: Mrs. Benfer is a 57 year-old female returning for follow-up of relapsing remitting multiple sclerosis.  She initially had paraparetic illness due to thoracic cord demyelination  involving the upper and midthoracic level in March 2016. Her symptoms improved after receiving a course of methylprednisolone and she no longer has leg weakness, but continues to have hyperesthesia involving her mid back down.MRI thoracic spine from 2016 showed short segment of T2 hyperintensity over ~ T3 and T8 levels.   She had surveillance imaging of her brain, cervical, and thoracic cord March 2017 which shows a new left  temporal tip hyperintensity.  MRI brain in October 2018 showed new left lateral ventricle lesion.  She has been reluctant to start disease modifying therapies in the past, but today, she seems interested in [place] after her new insurance plan takes effect.  Today, she is having worsening left leg paresthesia and hypersensitivities most likely due to new life stressors.  Her exam is stable, no new findings.  Low threshold to repeat MRI brain, cervical spine, and thoracic spine if she has new symptoms.     PLAN/RECOMMENDATIONS:  1.  Start gabapentin 300mg  at bedtime x 2 weeks, then increase to 1 tablet twice daily 2. Continue flexeril to 5mg  at bedtime for cramps  3.  Continue vitamin D 4000 units daily 4.  Plan to start Tecfidera once her new insurance plan takes effect.    Return to clinic in 4 months  Greater than 50% of this 30 minute visit was spent in counseling, explanation of diagnosis, planning of further management, and coordination of care re:  MS.     Thank you for allowing me to participate in patient's care.  If I can answer any additional questions, I would be pleased to do so.    Sincerely,    Natalin Bible K. Allena Katz, DO

## 2018-04-03 ENCOUNTER — Other Ambulatory Visit: Payer: Self-pay | Admitting: Neurology

## 2018-04-14 ENCOUNTER — Other Ambulatory Visit: Payer: Self-pay | Admitting: Neurology

## 2018-05-24 ENCOUNTER — Telehealth: Payer: Self-pay | Admitting: General Practice

## 2018-05-24 NOTE — Telephone Encounter (Signed)
Called pt to verify if she had received her second Shingrix vaccine. No answer on phone and no voicemail set up. Will try calling again.   Ok for [facility] to Discuss results / PCP recommendations / Schedule patient.

## 2018-05-31 ENCOUNTER — Encounter: Payer: Self-pay | Admitting: General Practice

## 2018-05-31 NOTE — Telephone Encounter (Signed)
Letter also mailed to pt. closing encounter until pt responds.

## 2018-06-07 ENCOUNTER — Ambulatory Visit: Payer: Self-pay

## 2018-06-08 ENCOUNTER — Encounter: Payer: Self-pay | Admitting: Physician Assistant

## 2018-06-08 ENCOUNTER — Telehealth: Payer: Self-pay | Admitting: Family

## 2018-06-08 DIAGNOSIS — N390 Urinary tract infection, site not specified: Secondary | ICD-10-CM

## 2018-06-08 DIAGNOSIS — B3731 Acute candidiasis of vulva and vagina: Secondary | ICD-10-CM

## 2018-06-08 DIAGNOSIS — B373 Candidiasis of vulva and vagina: Secondary | ICD-10-CM

## 2018-06-08 MED ORDER — FLUCONAZOLE 150 MG PO TABS: 150.0000 mg | ORAL_TABLET | Freq: Once | ORAL | 0 refills | Status: AC

## 2018-06-08 MED ORDER — CEPHALEXIN 500 MG PO CAPS: 500.0000 mg | ORAL_CAPSULE | Freq: Two times a day (BID) | ORAL | 0 refills | Status: DC

## 2018-06-08 NOTE — Progress Notes (Signed)
Thank you for the details you included in the comment boxes. Those details are very helpful in determining the best course of treatment for you and help [place] to provide the best care. I sent Diflucan 150mg  x 1 dose for the possible yeast infection as well.   We are sorry that you are not feeling well.  Here is how we plan to help!  Based on what you shared with me it looks like you most likely have a simple urinary tract infection.  A UTI (Urinary Tract Infection) is a bacterial infection of the bladder.  Most cases of urinary tract infections are simple to treat but a key part of your care is to encourage you to drink plenty of fluids and watch your symptoms carefully.  I have prescribed Keflex 500 mg twice a day for 7 days.  Your symptoms should gradually improve. Call us if the burning in your urine worsens, you develop worsening fever, back pain or pelvic pain or if your symptoms do not resolve after completing the antibiotic.  Urinary tract infections can be prevented by drinking plenty of water to keep your body hydrated.  Also be sure when you wipe, wipe from front to back and don't hold it in!  If possible, empty your bladder every 4 hours.  Your e-visit answers were reviewed by a board certified advanced clinical practitioner to complete your personal care plan.  Depending on the condition, your plan could have included both over the counter or prescription medications.  If there is a problem please reply  once you have received a response from your provider.  Your safety is important to [place].  If you have drug allergies check your prescription carefully.    You can use MyChart to ask questions about today's visit, request a non-urgent call back, or ask for a work or school excuse for 24 hours related to this e-Visit. If it has been greater than 24 hours you will need to follow up with your provider, or enter a new e-Visit to address those concerns.   You will get an e-mail in the next  two days asking about your experience.  I hope that your e-visit has been valuable and will speed your recovery. Thank you for using e-visits.

## 2018-07-20 ENCOUNTER — Ambulatory Visit: Payer: BLUE CROSS/BLUE SHIELD | Admitting: Neurology

## 2018-08-20 ENCOUNTER — Ambulatory Visit: Payer: BLUE CROSS/BLUE SHIELD | Admitting: Neurology

## 2018-08-28 ENCOUNTER — Other Ambulatory Visit: Payer: Self-pay | Admitting: Physician Assistant

## 2018-09-24 ENCOUNTER — Other Ambulatory Visit: Payer: Self-pay | Admitting: Physician Assistant

## 2018-10-28 ENCOUNTER — Other Ambulatory Visit: Payer: Self-pay | Admitting: Physician Assistant

## 2018-11-01 ENCOUNTER — Other Ambulatory Visit: Payer: Self-pay | Admitting: Neurology

## 2018-12-30 DIAGNOSIS — M79605 Pain in left leg: Secondary | ICD-10-CM | POA: Insufficient documentation

## 2019-01-06 ENCOUNTER — Other Ambulatory Visit: Payer: Self-pay | Admitting: Neurology

## 2019-03-28 ENCOUNTER — Telehealth: Payer: Self-pay | Admitting: Neurology

## 2019-03-28 NOTE — Telephone Encounter (Signed)
Left msg with after hours about stans? And will like to know if its done... thanks!

## 2019-03-29 ENCOUNTER — Other Ambulatory Visit: Payer: Self-pay | Admitting: Neurology

## 2019-03-29 DIAGNOSIS — G35 Multiple sclerosis: Secondary | ICD-10-CM

## 2019-03-29 NOTE — Telephone Encounter (Signed)
Ordered MRI--brain, Cervical spine, and Thoracic spine. Pt stated will make an appt to the office as soon get a call from the [facility] for appt.

## 2019-03-29 NOTE — Telephone Encounter (Signed)
Spoke to Regina Griffin--stated last year had MRI scan on the neck/head and due to changing job and insurance Regina Griffin have not had the chance to make an appt or get the scan done. Notified Regina Griffin to make an appt since have not seen Dr. Allena Katz in 1 year. Regina Griffin have no problem making an appt and just wanted to know if needed appt first and then scan. Please advise

## 2019-03-29 NOTE — Telephone Encounter (Signed)
OK to order MRI brain, cervical, and thoracic spine wwo contrast.  Follow-up after MRI.

## 2019-04-02 ENCOUNTER — Other Ambulatory Visit: Payer: Self-pay | Admitting: Neurology

## 2019-04-18 ENCOUNTER — Ambulatory Visit
Admission: RE | Admit: 2019-04-18 | Discharge: 2019-04-18 | Disposition: A | Discharge status: Home / Self Care | Payer: BLUE CROSS/BLUE SHIELD | Source: Ambulatory Visit | Attending: Neurology | Admitting: Neurology

## 2019-04-18 ENCOUNTER — Other Ambulatory Visit: Payer: BLUE CROSS/BLUE SHIELD

## 2019-04-18 ENCOUNTER — Other Ambulatory Visit: Payer: Self-pay

## 2019-04-18 DIAGNOSIS — G35 Multiple sclerosis: Secondary | ICD-10-CM

## 2019-04-18 DIAGNOSIS — G9389 Other specified disorders of brain: Secondary | ICD-10-CM | POA: Diagnosis not present

## 2019-04-18 MED ORDER — GADOBENATE DIMEGLUMINE 529 MG/ML IV SOLN
14.0000 mL | Freq: Once | INTRAVENOUS | Status: AC | PRN
  Administered 2019-04-18: 10:00:00 14 mL via INTRAVENOUS

## 2019-04-20 ENCOUNTER — Ambulatory Visit
Admission: RE | Admit: 2019-04-20 | Discharge: 2019-04-20 | Disposition: A | Discharge status: Home / Self Care | Payer: BLUE CROSS/BLUE SHIELD | Source: Ambulatory Visit | Attending: Neurology | Admitting: Neurology

## 2019-04-20 ENCOUNTER — Other Ambulatory Visit: Payer: Self-pay

## 2019-04-20 DIAGNOSIS — G35 Multiple sclerosis: Secondary | ICD-10-CM

## 2019-04-20 DIAGNOSIS — M2578 Osteophyte, vertebrae: Secondary | ICD-10-CM | POA: Diagnosis not present

## 2019-04-20 DIAGNOSIS — M5124 Other intervertebral disc displacement, thoracic region: Secondary | ICD-10-CM | POA: Diagnosis not present

## 2019-04-20 DIAGNOSIS — M4802 Spinal stenosis, cervical region: Secondary | ICD-10-CM | POA: Diagnosis not present

## 2019-04-20 MED ORDER — GADOBENATE DIMEGLUMINE 529 MG/ML IV SOLN
15.0000 mL | Freq: Once | INTRAVENOUS | Status: AC | PRN
  Administered 2019-04-20: 15 mL via INTRAVENOUS

## 2019-04-24 ENCOUNTER — Other Ambulatory Visit: Payer: Self-pay

## 2019-04-24 ENCOUNTER — Telehealth (INDEPENDENT_AMBULATORY_CARE_PROVIDER_SITE_OTHER): Payer: BLUE CROSS/BLUE SHIELD | Admitting: Neurology

## 2019-04-24 ENCOUNTER — Encounter: Payer: Self-pay | Admitting: Neurology

## 2019-04-24 VITALS — Ht 64.0 in | Wt 152.0 lb

## 2019-04-24 DIAGNOSIS — G35 Multiple sclerosis: Secondary | ICD-10-CM | POA: Diagnosis not present

## 2019-04-24 DIAGNOSIS — M25552 Pain in left hip: Secondary | ICD-10-CM

## 2019-04-24 DIAGNOSIS — M62838 Other muscle spasm: Secondary | ICD-10-CM | POA: Diagnosis not present

## 2019-04-24 DIAGNOSIS — E0789 Other specified disorders of thyroid: Secondary | ICD-10-CM

## 2019-04-24 DIAGNOSIS — E079 Disorder of thyroid, unspecified: Secondary | ICD-10-CM

## 2019-04-24 DIAGNOSIS — G373 Acute transverse myelitis in demyelinating disease of central nervous system: Secondary | ICD-10-CM

## 2019-04-24 MED ORDER — CYCLOBENZAPRINE HCL 5 MG PO TABS: 5.0000 mg | ORAL_TABLET | Freq: Two times a day (BID) | ORAL | 3 refills | Status: DC | PRN

## 2019-04-24 MED ORDER — GABAPENTIN 300 MG PO CAPS: ORAL_CAPSULE | ORAL | 3 refills | Status: DC

## 2019-04-24 NOTE — Progress Notes (Signed)
Virtual Visit via Video Note The purpose of this virtual visit is to provide medical care while limiting exposure to the novel coronavirus.    Consent was obtained for video visit:  Yes.   Answered questions that patient had about telehealth interaction:  Yes.   I discussed the limitations, risks, security and privacy concerns of performing an evaluation and management service by telemedicine. I also discussed with the patient that there may be a patient responsible charge related to this service. The patient expressed understanding and agreed to proceed.  Pt location: Home Physician Location: office Name of referring provider:  Waldon MerlMartin, William C, PA-C I connected with Regina Griffin at patients initiation/request on 04/24/2019 at  8:50 AM EDT by video enabled telemedicine application and verified that I am speaking with the correct person using two identifiers. Pt MRN:  [id number] Pt DOB:  08/18/1961 Video Participants:  Regina Salmeresa Portocarrero   History of Present Illness: This is a 58 y.o. female returning for follow-up of multiple sclerosis (dx 12/2014) manifesting with thoracic myelopathy.  She was last seen in May 2019 at which time she was undecided on whether to start Tecfidera.  In the past, she has been very reluctant to consider disease modifying therapies.  She had surveillance imaging of the brain, cervical spine, and thoracic spine earlier this week.  Findings show an new lesion in the right posterior cerebral cortex, no new spinal cord lesions.  She has ongoing cervical spondylosis and disc protrusion.  There is also note of mild increase in the size of her thyroid lesion, which she will need further evaluation.    She is complaining of left buttocks and hip pain, described as achy and sharp, which started in November 2019.  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  She was laid off from this job in January 2020.  In March, she started  having more radiating pain and spasm in the leg.  She is taking Aleve nightly and feels this may help some.  Heat application also helps.  She takes Flexeril occasionally and gabapentin 300 mg twice daily.  She denies any leg weakness, but feels that it is unstable if she is trying to weight-bear on that leg.  No new numbness/tingling of the legs or bowel/bladder dysfunction.   She is currently not working.   Observations/Objective:   Vitals:   04/24/19 0804  Weight: 152 lb (68.9 kg)  Height: 5\' 4"  (1.626 m)   Patient is awake, alert, and appears comfortable.  Oriented x 4.   Extraocular muscles are intact. No ptosis.  Face is symmetric.  Speech is not dysarthric. Tongue is midline. Antigravity in all extremities.  No pronator drift. Gait appear antalgic, favoring the right leg.  She is able to stand on each leg independently, but feels unsteady when standing on the left leg in isolation.Marland Kitchen.   DATA:  MRI brain wwo contrast 04/18/2019: 1. New 7 mm white matter lesion in the right parietal lobe consistent with the patient's history of demyelinating disease. No evidence of active demyelination. 2. Near complete resolution of the new lesion adjacent to the atrium of the left lateral ventricle on the prior study.  MRI cervical spine wwo contrast 04/21/2019: Multilevel degenerative change in the cervical spine causing spinal and foraminal stenosis as above. Progressive spurring and cord flattening on the right at C6-7 Progressive moderate right foraminal stenosis at C7-T1 29 mm right thyroid nodule with enlargement  since the prior study.  Recommend thyroid ultrasound and biopsy.  MRI thoracic spine wwo contrast 04/21/2019: 1. Subtle cord lesions at T2 in T6-7 unchanged. Subtle lesion at T8 not well seen today. No new cord lesions are identified. 2. Multiple disc protrusions throughout the thoracic spine similar to the prior study. There is improvement in the central disc protrusion at T8-9. 3.  Right thyroid lesion with solid enhancement shows mild interval  growth. Recommend thyroid ultrasound and possible biopsy.   Assessment and Plan:  1.  Relapsing remitting multiple sclerosis (12/2014) initially manifesting with thoracic myelopathy and with subsequent imaging showing mild progression of disease.  She has elected not to be on disease modifying therapies which I have discussed in the past, and again today.  She did think about Tecfidera last year, however has not committed to this.  MRI performed on 04/18/2019 was personally viewed and shows new lesion in the right parietal lobe, no new cord lesions.  She was informed that the goal of management is to see no evidence of disease activity on imaging, whether she is symptomatic or not.  The goal of treatment is to minimize future functional disability.  She will contact my office if she chooses to proceed with DMTs.   2.  Left hip and back pain seems more musculoskeletal - new  -  Increase Flexeril to 5 mg twice daily as needed for left leg pain  -  Continue gabapentin 300 mg twice daily  -  Start physical therapy for left leg stretching and strengthening  3.  Right thyroid lesion seen on MRI cervical spine  -  Follow-up with PCP for further evaluation of thyroid lesion and hip discomfort, if no better with PT and muscle relaxants   Follow Up Instructions:   I discussed the assessment and treatment plan with the patient. The patient was provided an opportunity to ask questions and all were answered. The patient agreed with the plan and demonstrated an understanding of the instructions.   The patient was advised to call back or seek an in-person evaluation if the symptoms worsen or if the condition fails to improve as anticipated.  Follow-up in 6 months  Total time spent:  30 min   Alda Berthold, DO

## 2019-04-25 ENCOUNTER — Encounter: Payer: Self-pay | Admitting: Physician Assistant

## 2019-04-25 ENCOUNTER — Other Ambulatory Visit: Payer: Self-pay | Admitting: Physician Assistant

## 2019-04-25 ENCOUNTER — Ambulatory Visit: Payer: BLUE CROSS/BLUE SHIELD | Admitting: Physician Assistant

## 2019-04-25 VITALS — BP 110/78 | HR 89 | Temp 98.3°F | Resp 16 | Ht 64.0 in | Wt 152.4 lb

## 2019-04-25 DIAGNOSIS — E041 Nontoxic single thyroid nodule: Secondary | ICD-10-CM | POA: Diagnosis not present

## 2019-04-25 DIAGNOSIS — F17200 Nicotine dependence, unspecified, uncomplicated: Secondary | ICD-10-CM | POA: Diagnosis not present

## 2019-04-25 DIAGNOSIS — Z23 Encounter for immunization: Secondary | ICD-10-CM

## 2019-04-25 DIAGNOSIS — M25552 Pain in left hip: Secondary | ICD-10-CM

## 2019-04-25 MED ORDER — BUPROPION HCL ER (XL) 150 MG PO TB24: 150.0000 mg | ORAL_TABLET | Freq: Every day | ORAL | 1 refills | Status: DC

## 2019-04-25 NOTE — Patient Instructions (Signed)
Please stop by the front desk to speak with [facility] regarding [place] and Biopsy. You will be contacted by Sports Medicine for further assessment of the hip. Please follow-up with your Neurologist as scheduled.  In regards to smoking, we are restarting the Wellbutrin XL. Please take 1 tablet daily as directed. Follow-up with me in 3-4 weeks for reassessment of smoking and a complete physical with fasting labs.

## 2019-04-25 NOTE — Progress Notes (Signed)
Patient presents to clinic today with multiple issues needing addressed.   Patient would like to discuss restarting Wellbutrin for smoking cessation. Was previously on Wellbutrin XL 150 mg daily for tobacco abuse disorder. Tolerated well and was able to completely stop smoking on this regimen. Notes recently picked it back up due to stressors with COVID. Is smoking about 5+ cigarettes per day. Denies depressed mood or anhedonia. Denies anxiety.   Patient also had recent MRI cervical spine with Neurology due to history of MS. Her previous R thyroid nodule was noted on MRI but noted to have increased from 16 mm at last [place] to 29 mm. Thyroid US and biopsy recommended. Patient denies difficulty swallowing or voice hoarseness.   Patient also noting significant left hip pain that is always there any time she tries to ambulate. Denies trauma or injury. Neurologist evaluated at her last appointment and determined does not seem related to her MS. Notes that hip pain is affecting her gait. Pain sometimes a 9/10 with walking. Resolves after sitting a few minutes. Denies any pain in legs with ambulation.  Past Medical History:  Diagnosis Date  . Colon polyps    Benign  . History of chicken pox   . HPV in female   . Shingles   . Thyroid disease    Nodules 2014, benign  . UTI (lower urinary tract infection)     Current Outpatient Medications on File Prior to Visit  Medication Sig Dispense Refill  . Cholecalciferol (VITAMIN D3) 5000 UNITS CAPS Take 5,000 Units by mouth daily.    . cyclobenzaprine (FLEXERIL) 5 MG tablet Take 1 tablet (5 mg total) by mouth 2 (two) times daily as needed for muscle spasms. 60 tablet 3  . gabapentin (NEURONTIN) 300 MG capsule 1 CAPSULE TWICE DAILY 180 capsule 3  . HYDROcodone-acetaminophen (NORCO) 7.5-325 MG per tablet Take 1 tablet by mouth every 6 (six) hours as needed for moderate pain.    . Multiple Vitamin (MULTIVITAMIN) tablet Take 1 tablet by mouth daily.     No  current facility-administered medications on file prior to visit.     No Known Allergies  Family History  Problem Relation Age of Onset  . Lung cancer Mother 60       Deceased  . Prostate cancer Father 79       Deceased  . Pancreatic cancer Paternal Uncle   . Healthy Brother        x3  . Healthy Sister        x2  . Menstrual problems Daughter        x1    Social History   Socioeconomic History  . Marital status: Legally Separated    Spouse name: Not on file  . Number of children: 1  . Years of education: Not on file  . Highest education level: Not on file  Occupational History  . Not on file  Social Needs  . Financial resource strain: Not on file  . Food insecurity    Worry: Not on file    Inability: Not on file  . Transportation needs    Medical: Not on file    Non-medical: Not on file  Tobacco Use  . Smoking status: Former Smoker    Packs/day: 0.50    Years: 25.00    Pack years: 12.50    Quit date: 07/31/2017    Years since quitting: 1.7  . Smokeless tobacco: Never Used  Substance and Sexual Activity  . Alcohol use:  Yes    Alcohol/week: 2.0 - 3.0 standard drinks    Types: 2 - 3 Standard drinks or equivalent per week    Comment: social  . Drug use: No  . Sexual activity: Yes  Lifestyle  . Physical activity    Days per week: Not on file    Minutes per session: Not on file  . Stress: Not on file  Relationships  . Social Herbalist on phone: Not on file    Gets together: Not on file    Attends religious service: Not on file    Active member of club or organization: Not on file    Attends meetings of clubs or organizations: Not on file    Relationship status: Not on file  Other Topics Concern  . Not on file  Social History Narrative   Lives with husband in a 2 story home.        Has one daughter.         Unemployed      Education: associates degree.      Right handed   Review of Systems - See HPI.  All other ROS are negative.  BP  110/78   Pulse 89   Temp 98.3 F (36.8 C) (Skin)   Resp 16   Ht 5\' 4"  (1.626 m)   Wt 152 lb 6.4 oz (69.1 kg)   SpO2 98%   BMI 26.16 kg/m   Physical Exam Vitals signs reviewed.  Constitutional:      Appearance: Normal appearance.  HENT:     Head: Normocephalic and atraumatic.     Mouth/Throat:     Mouth: Mucous membranes are moist.  Eyes:     Conjunctiva/sclera: Conjunctivae normal.  Neck:     Musculoskeletal: Neck supple.     Thyroid: Thyroid mass (noted on exam as focal enlargement of thyroid on R-side. ) and thyromegaly (focal r-sided enlargement) present. No thyroid tenderness.  Cardiovascular:     Rate and Rhythm: Normal rate and regular rhythm.     Pulses: Normal pulses.     Heart sounds: Normal heart sounds.  Musculoskeletal:     Left hip: She exhibits normal range of motion, normal strength, no tenderness and no swelling.     Lumbar back: Normal.  Neurological:     Mental Status: She is alert.    Assessment/Plan: 1. Thyroid nodule Chronic nodule, increased in size per MRI. Will repeat US thyroid and proceed with biopsy.  - US Thyroid Biopsy; Future - US THYROID; Future  2. Need for shingles vaccine Shingrix 1 of 2 given. Had to restart series as last injection was in 2018. Final injection to be given in 2-6 months.  - Varicella-zoster vaccine subcutaneous  3. Tobacco use disorder Restart Wellbutrin xl 150 mg daily. Follow-up 1 month. CPE at that time. .  - buPROPion (WELLBUTRIN XL) 150 MG 24 hr tablet; Take 1 tablet (150 mg total) by mouth daily.  Dispense: 30 tablet; Refill: 1  4. Left hip pain Strength 5/5 on examination. Normal ROM. No noted point tenderness on examination. Tightness in posterior thigh noted by patient with ROM. Will refer to sports medicine for further evaluation -- may need gait assessment and PT versus imaging.  - Ambulatory referral to [place], PA-C

## 2019-04-26 ENCOUNTER — Telehealth: Payer: Self-pay | Admitting: Physician Assistant

## 2019-04-26 NOTE — Telephone Encounter (Signed)
Advised patient that PCP prefers she see Sports Medicine first before starting PT. She is agreeable. Referral placed to Sports Medicine

## 2019-04-26 NOTE — Telephone Encounter (Signed)
Patient is calling because Einar Pheasant had discussed setting up PT. The patient received a message from [facility] that was ordered through Dr. Posey Pronto the patient's neurologist.  The patient is requesting Einar Pheasant to discuss PT with Dr. Posey Pronto. Should the patient go through the PT through Dr Posey Pronto or through [place]. [facility] can get the Patient in for an eval on Monday. Please advise  [phone]

## 2019-04-26 NOTE — Telephone Encounter (Signed)
I was setting up Sports Medicine assessment, not PT. I would advise seeing sports medicine provider first.

## 2019-05-01 ENCOUNTER — Ambulatory Visit: Payer: BLUE CROSS/BLUE SHIELD | Admitting: Family Medicine

## 2019-05-01 ENCOUNTER — Ambulatory Visit
Admission: RE | Admit: 2019-05-01 | Discharge: 2019-05-01 | Disposition: A | Discharge status: Home / Self Care | Payer: BLUE CROSS/BLUE SHIELD | Source: Ambulatory Visit | Attending: Family Medicine | Admitting: Family Medicine

## 2019-05-01 ENCOUNTER — Other Ambulatory Visit: Payer: Self-pay

## 2019-05-01 VITALS — BP 100/66 | Ht 64.0 in | Wt 152.0 lb

## 2019-05-01 DIAGNOSIS — M25552 Pain in left hip: Secondary | ICD-10-CM

## 2019-05-01 DIAGNOSIS — M1612 Unilateral primary osteoarthritis, left hip: Secondary | ICD-10-CM | POA: Diagnosis not present

## 2019-05-01 NOTE — Patient Instructions (Signed)
Your pain is coming from the hip joint. Get x-rays today after you leave - we will call you with results and next steps. We need to make sure you don't have avascular necrosis of this hip. It's possible you just have a severe flare of arthritis. Continue with the gabapentin and flexeril. Topical biofreeze or aspercreme up to 4 times a day. Next steps will depend on the x-ray results.

## 2019-05-03 ENCOUNTER — Encounter: Payer: Self-pay | Admitting: Physician Assistant

## 2019-05-03 ENCOUNTER — Encounter: Payer: Self-pay | Admitting: Family Medicine

## 2019-05-03 NOTE — Progress Notes (Signed)
Radiographs reviewed and discussed with patient.  Unfortunately she has severe arthritis of her hip.  While it's possible she had AVN this has progressed to the point that an MRI wouldn't change management of her condition.  Will refer to ortho to discuss total hip replacement. Advised conservative measures are an option but unlikely to be successful with the advanced state of her arthritis.

## 2019-05-06 ENCOUNTER — Encounter: Payer: Self-pay | Admitting: Family Medicine

## 2019-05-06 NOTE — Progress Notes (Signed)
Appt with Dr Fredonia Highland at [facility] [place] [id number] [phone] Wednesday July 8th at [place]

## 2019-05-06 NOTE — Progress Notes (Signed)
PCP and consultation requested by: Brunetta Jeans, PA-C  Subjective:   HPI: Patient is a 58 y.o. female here for left hip pain.  Patient reports for the past 1.5 months she's had progressively worsening left hip/groin pain. Seemed to start in thigh then to buttock and now to hip. Difficulty bearing weight due to pain and walking with sharp 8/10 level pain. Feels stiff when she's at work where she has to sit for prolonged periods. Took aleve with minimal benefit. No skin changes, numbness, radiation into leg.  Past Medical History:  Diagnosis Date  . Colon polyps    Benign  . History of chicken pox   . HPV in female   . Shingles   . Thyroid disease    Nodules 2014, benign  . UTI (lower urinary tract infection)     Current Outpatient Medications on File Prior to Visit  Medication Sig Dispense Refill  . buPROPion (WELLBUTRIN XL) 150 MG 24 hr tablet Take 1 tablet (150 mg total) by mouth daily. 30 tablet 1  . Cholecalciferol (VITAMIN D3) 5000 UNITS CAPS Take 5,000 Units by mouth daily.    . cyclobenzaprine (FLEXERIL) 5 MG tablet Take 1 tablet (5 mg total) by mouth 2 (two) times daily as needed for muscle spasms. 60 tablet 3  . gabapentin (NEURONTIN) 300 MG capsule 1 CAPSULE TWICE DAILY 180 capsule 3  . HYDROcodone-acetaminophen (NORCO) 7.5-325 MG per tablet Take 1 tablet by mouth every 6 (six) hours as needed for moderate pain.    . Multiple Vitamin (MULTIVITAMIN) tablet Take 1 tablet by mouth daily.     No current facility-administered medications on file prior to visit.     Past Surgical History:  Procedure Laterality Date  . CESAREAN SECTION     1996  . CHOLECYSTECTOMY     2012  . WISDOM TOOTH EXTRACTION      No Known Allergies  Social History   Socioeconomic History  . Marital status: Legally Separated    Spouse name: Not on file  . Number of children: 1  . Years of education: Not on file  . Highest education level: Not on file  Occupational History  . Not  on file  Social Needs  . Financial resource strain: Not on file  . Food insecurity    Worry: Not on file    Inability: Not on file  . Transportation needs    Medical: Not on file    Non-medical: Not on file  Tobacco Use  . Smoking status: Former Smoker    Packs/day: 0.50    Years: 25.00    Pack years: 12.50    Quit date: 07/31/2017    Years since quitting: 1.7  . Smokeless tobacco: Never Used  Substance and Sexual Activity  . Alcohol use: Yes    Alcohol/week: 2.0 - 3.0 standard drinks    Types: 2 - 3 Standard drinks or equivalent per week    Comment: social  . Drug use: No  . Sexual activity: Yes  Lifestyle  . Physical activity    Days per week: Not on file    Minutes per session: Not on file  . Stress: Not on file  Relationships  . Social Herbalist on phone: Not on file    Gets together: Not on file    Attends religious service: Not on file    Active member of club or organization: Not on file    Attends meetings of clubs or organizations: Not  on file    Relationship status: Not on file  . Intimate partner violence    Fear of current or ex partner: Not on file    Emotionally abused: Not on file    Physically abused: Not on file    Forced sexual activity: Not on file  Other Topics Concern  . Not on file  Social History Narrative   Lives with husband in a 2 story home.        Has one daughter.         Unemployed      Education: associates degree.      Right handed    Family History  Problem Relation Age of Onset  . Lung cancer Mother [id number]       Deceased  . Prostate cancer Father [phone]       Deceased  . Pancreatic cancer Paternal Uncle   . Healthy Brother        x3  . Healthy Sister        x2  . Menstrual problems Daughter        x1    BP 100/66   Ht 5\' 4"  (1.626 m)   Wt 152 lb (68.9 kg)   BMI 26.09 kg/m   Review of Systems: See HPI above.     Objective:  Physical Exam:  Gen: NAD, comfortable in exam room  Back: No gross  deformity, scoliosis. No paraspinal TTP .  No midline or bony TTP. FROM. Strength LEs 5/5 all muscle groups.   Negative SLRs. Sensation intact to light touch bilaterally.  Left hip: No deformity. Markedly reduced ROM compared to right with pain.  Strength 5/5 hip flexion No tenderness to palpation trochanter, SI joint. Positive logroll, fadir.  Anterior pain with faber. NVI distlaly.   Assessment & Plan:  1. Left hip pain - concerning for severe arthritis vs AVN.  Will go ahead with radiographs to further assess.  Continue her gabapentin and flexeril.  Topical biofreeze or aspercreme.  Next steps dependent on x-ray results - to include possible MRI, PT, injection, or ortho referral.

## 2019-05-08 ENCOUNTER — Telehealth: Payer: Self-pay | Admitting: Physician Assistant

## 2019-05-08 ENCOUNTER — Encounter: Payer: Self-pay | Admitting: Physician Assistant

## 2019-05-08 DIAGNOSIS — M25552 Pain in left hip: Secondary | ICD-10-CM | POA: Diagnosis not present

## 2019-05-08 NOTE — Telephone Encounter (Signed)
I have placed a surgical clearance form in the bin upfront with a charge sheet  °

## 2019-05-08 NOTE — Telephone Encounter (Signed)
Surgical clearance in your folder for review and completion

## 2019-05-09 NOTE — Telephone Encounter (Signed)
MyChart message sent to patient for her to schedule nurse visit for labs and EKG so clearance can be granted. She was recently seen for other issues so no repeat examination necessary at present.

## 2019-05-10 ENCOUNTER — Ambulatory Visit
Admission: RE | Admit: 2019-05-10 | Discharge: 2019-05-10 | Disposition: A | Discharge status: Home / Self Care | Payer: BLUE CROSS/BLUE SHIELD | Source: Ambulatory Visit | Attending: Physician Assistant | Admitting: Physician Assistant

## 2019-05-10 ENCOUNTER — Ambulatory Visit (INDEPENDENT_AMBULATORY_CARE_PROVIDER_SITE_OTHER): Payer: BLUE CROSS/BLUE SHIELD | Admitting: Physician Assistant

## 2019-05-10 ENCOUNTER — Other Ambulatory Visit: Payer: Self-pay

## 2019-05-10 ENCOUNTER — Encounter: Payer: Self-pay | Admitting: Physician Assistant

## 2019-05-10 VITALS — BP 100/70 | HR 90 | Temp 98.0°F | Resp 16 | Ht 64.0 in | Wt 154.0 lb

## 2019-05-10 DIAGNOSIS — Z01818 Encounter for other preprocedural examination: Secondary | ICD-10-CM

## 2019-05-10 DIAGNOSIS — E041 Nontoxic single thyroid nodule: Secondary | ICD-10-CM

## 2019-05-10 DIAGNOSIS — E048 Other specified nontoxic goiter: Secondary | ICD-10-CM | POA: Diagnosis not present

## 2019-05-10 LAB — COMPREHENSIVE METABOLIC PANEL
ALT: 27 U/L (ref 0–35)
AST: 28 U/L (ref 0–37)
Albumin: 4.2 g/dL (ref 3.5–5.2)
Alkaline Phosphatase: 75 U/L (ref 39–117)
BUN: 14 mg/dL (ref 6–23)
CO2: 27 mEq/L (ref 19–32)
Calcium: 9.1 mg/dL (ref 8.4–10.5)
Chloride: 106 mEq/L (ref 96–112)
Creatinine, Ser: 0.85 mg/dL (ref 0.40–1.20)
GFR: 69.01 mL/min (ref >60.00)
Glucose, Bld: 74 mg/dL (ref 70–99)
Potassium: 4.2 mEq/L (ref 3.5–5.1)
Sodium: 141 mEq/L (ref 135–145)
Total Bilirubin: 0.8 mg/dL (ref 0.2–1.2)
Total Protein: 6.7 g/dL (ref 6.0–8.3)

## 2019-05-10 LAB — PROTIME-INR
INR: 1.1 ratio — ABNORMAL HIGH (ref 0.8–1.0)
Prothrombin Time: 12.6 s (ref 9.6–13.1)

## 2019-05-10 LAB — CBC WITH DIFFERENTIAL/PLATELET
Basophils Absolute: 0.1 10*3/uL (ref 0.0–0.1)
Basophils Relative: 1.2 % (ref 0.0–3.0)
Eosinophils Absolute: 0.1 10*3/uL (ref 0.0–0.7)
Eosinophils Relative: 2 % (ref 0.0–5.0)
HCT: 39 % (ref 36.0–46.0)
Hemoglobin: 13.1 g/dL (ref 12.0–15.0)
Lymphocytes Relative: 27.5 % (ref 12.0–46.0)
Lymphs Abs: 2.1 10*3/uL (ref 0.7–4.0)
MCHC: 33.5 g/dL (ref 30.0–36.0)
MCV: 95.7 fl (ref 78.0–100.0)
Monocytes Absolute: 0.5 10*3/uL (ref 0.1–1.0)
Monocytes Relative: 6.7 % (ref 3.0–12.0)
Neutro Abs: 4.7 10*3/uL (ref 1.4–7.7)
Neutrophils Relative %: 62.6 % (ref 43.0–77.0)
Platelets: 266 10*3/uL (ref 150.0–400.0)
RBC: 4.08 Mil/uL (ref 3.87–5.11)
RDW: 12.9 % (ref 11.5–15.5)
WBC: 7.5 10*3/uL (ref 4.0–10.5)

## 2019-05-10 NOTE — Patient Instructions (Signed)
I will send in all of the lab results, EKG and notes once labs are in. Then they will contact you to schedule your surgery.  Take care!

## 2019-05-10 NOTE — Progress Notes (Signed)
Patient presents to clinic today for pre-operative clearance for left total hip arthroplasty with Dr. Percell Miller. Date pending surgical clearance. Has history of PONV with pregnancy only. Last few surgeries have been without an issue. Patient denies chest pain, palpitations, lightheadedness, dizziness, vision changes or frequent headaches.   Past Medical History:  Diagnosis Date  . Colon polyps    Benign  . History of chicken pox   . HPV in female   . Shingles   . Thyroid disease    Nodules 2014, benign  . UTI (lower urinary tract infection)     Current Outpatient Medications on File Prior to Visit  Medication Sig Dispense Refill  . buPROPion (WELLBUTRIN XL) 150 MG 24 hr tablet Take 1 tablet (150 mg total) by mouth daily. 30 tablet 1  . Cholecalciferol (VITAMIN D3) 5000 UNITS CAPS Take 5,000 Units by mouth daily.    . cyclobenzaprine (FLEXERIL) 5 MG tablet Take 1 tablet (5 mg total) by mouth 2 (two) times daily as needed for muscle spasms. 60 tablet 3  . gabapentin (NEURONTIN) 300 MG capsule 1 CAPSULE TWICE DAILY 180 capsule 3  . HYDROcodone-acetaminophen (NORCO) 7.5-325 MG per tablet Take 1 tablet by mouth every 6 (six) hours as needed for moderate pain.    . Multiple Vitamin (MULTIVITAMIN) tablet Take 1 tablet by mouth daily.     No current facility-administered medications on file prior to visit.     No Known Allergies  Family History  Problem Relation Age of Onset  . Lung cancer Mother 63       Deceased  . Prostate cancer Father 85       Deceased  . Pancreatic cancer Paternal Uncle   . Healthy Brother        x3  . Healthy Sister        x2  . Menstrual problems Daughter        x1    Social History   Socioeconomic History  . Marital status: Legally Separated    Spouse name: Not on file  . Number of children: 1  . Years of education: Not on file  . Highest education level: Not on file  Occupational History  . Not on file  Social Needs  . Financial resource  strain: Not on file  . Food insecurity    Worry: Not on file    Inability: Not on file  . Transportation needs    Medical: Not on file    Non-medical: Not on file  Tobacco Use  . Smoking status: Former Smoker    Packs/day: 0.50    Years: 25.00    Pack years: 12.50    Quit date: 07/31/2017    Years since quitting: 1.7  . Smokeless tobacco: Never Used  Substance and Sexual Activity  . Alcohol use: Yes    Alcohol/week: 2.0 - 3.0 standard drinks    Types: 2 - 3 Standard drinks or equivalent per week    Comment: social  . Drug use: No  . Sexual activity: Yes  Lifestyle  . Physical activity    Days per week: Not on file    Minutes per session: Not on file  . Stress: Not on file  Relationships  . Social Herbalist on phone: Not on file    Gets together: Not on file    Attends religious service: Not on file    Active member of club or organization: Not on file    Attends  meetings of clubs or organizations: Not on file    Relationship status: Not on file  Other Topics Concern  . Not on file  Social History Narrative   Lives with husband in a 2 story home.        Has one daughter.         Unemployed      Education: associates degree.      Right handed   Review of Systems - See HPI.  All other ROS are negative.  BP 100/70   Pulse 90   Temp 98 F (36.7 C) (Skin)   Resp 16   Ht _0  (1.626 m)   Wt 154 lb (69.9 kg)   SpO2 98%   BMI 26.43 kg/m   Physical Exam Vitals signs reviewed.  Constitutional:      Appearance: Normal appearance.  HENT:     Head: Normocephalic and atraumatic.  Eyes:     Conjunctiva/sclera: Conjunctivae normal.     Pupils: Pupils are equal, round, and reactive to light.  Neck:     Musculoskeletal: Neck supple.  Cardiovascular:     Rate and Rhythm: Normal rate and regular rhythm.  Pulmonary:     Effort: Pulmonary effort is normal.     Breath sounds: Normal breath sounds.  Neurological:     General: No focal deficit present.      Mental Status: She is alert and oriented to person, place, and time.  Psychiatric:        Mood and Affect: Mood normal.    Assessment/Plan: 1. Preoperative clearance EKG with NSR. Exam unremarkable. Will check lab panel today. If stable, clearance form will be sent over.  - EKG 12-Lead - CBC with Differential/Platelet - Protime-INR ( [facility] ONLY) - Comp Met (CMET) - POCT urinalysis dipstick   Leeanne Rio, PA-C

## 2019-05-15 DIAGNOSIS — M1612 Unilateral primary osteoarthritis, left hip: Secondary | ICD-10-CM | POA: Diagnosis not present

## 2019-05-15 DIAGNOSIS — M25552 Pain in left hip: Secondary | ICD-10-CM | POA: Diagnosis not present

## 2019-05-16 ENCOUNTER — Other Ambulatory Visit (HOSPITAL_COMMUNITY)
Admission: RE | Admit: 2019-05-16 | Discharge: 2019-05-16 | Disposition: A | Discharge status: Home / Self Care | Payer: BLUE CROSS/BLUE SHIELD | Source: Ambulatory Visit | Attending: Radiology | Admitting: Radiology

## 2019-05-16 ENCOUNTER — Ambulatory Visit
Admission: RE | Admit: 2019-05-16 | Discharge: 2019-05-16 | Disposition: A | Discharge status: Home / Self Care | Payer: BLUE CROSS/BLUE SHIELD | Source: Ambulatory Visit | Attending: Physician Assistant | Admitting: Physician Assistant

## 2019-05-16 DIAGNOSIS — E041 Nontoxic single thyroid nodule: Secondary | ICD-10-CM

## 2019-05-18 ENCOUNTER — Other Ambulatory Visit: Payer: Self-pay | Admitting: Physician Assistant

## 2019-05-18 DIAGNOSIS — F17200 Nicotine dependence, unspecified, uncomplicated: Secondary | ICD-10-CM

## 2019-05-20 ENCOUNTER — Ambulatory Visit (INDEPENDENT_AMBULATORY_CARE_PROVIDER_SITE_OTHER): Payer: BLUE CROSS/BLUE SHIELD | Admitting: Physician Assistant

## 2019-05-20 ENCOUNTER — Other Ambulatory Visit: Payer: Self-pay

## 2019-05-20 ENCOUNTER — Encounter: Payer: Self-pay | Admitting: Physician Assistant

## 2019-05-20 VITALS — BP 102/80 | HR 86 | Temp 98.4°F | Resp 16 | Ht 64.0 in | Wt 154.0 lb

## 2019-05-20 DIAGNOSIS — Z1211 Encounter for screening for malignant neoplasm of colon: Secondary | ICD-10-CM | POA: Diagnosis not present

## 2019-05-20 DIAGNOSIS — Z1239 Encounter for other screening for malignant neoplasm of breast: Secondary | ICD-10-CM

## 2019-05-20 DIAGNOSIS — Z0000 Encounter for general adult medical examination without abnormal findings: Secondary | ICD-10-CM | POA: Diagnosis not present

## 2019-05-20 DIAGNOSIS — F17200 Nicotine dependence, unspecified, uncomplicated: Secondary | ICD-10-CM

## 2019-05-20 LAB — LIPID PANEL
Cholesterol: 178 mg/dL (ref 0–200)
HDL: 72 mg/dL (ref >39.00)
LDL Cholesterol: 94 mg/dL (ref 0–99)
NonHDL: 105.86
Total CHOL/HDL Ratio: 2
Triglycerides: 61 mg/dL (ref 0.0–149.0)
VLDL: 12.2 mg/dL (ref 0.0–40.0)

## 2019-05-20 LAB — TSH: TSH: 1.08 u[IU]/mL (ref 0.35–4.50)

## 2019-05-20 MED ORDER — BUPROPION HCL ER (XL) 300 MG PO TB24: 300.0000 mg | ORAL_TABLET | Freq: Every day | ORAL | 0 refills | Status: DC

## 2019-05-20 NOTE — Progress Notes (Signed)
Patient presents to clinic today for annual exam.  Patient is fasting for labs.  Acute Concerns: Denies acute concerns today. Is scheduled for her hips surgery on Thursday.   Chronic Issues: Tobacco Use Disorder -- Is taking her Wellbutrin XL 150 mg daily as directed. Is tolerating well without issue but still noting significant cravings. Would like to discuss change in dose.   Health Maintenance: Immunizations -- UTD Colonoscopy -- Due. Asymptomatic. Mammogram -- Due. Followed by GYN for examination. [place] with order placement today. PAP -- UTD  Past Medical History:  Diagnosis Date  . Colon polyps    Benign  . History of chicken pox   . HPV in female   . Shingles   . Thyroid disease    Nodules 2014, benign  . UTI (lower urinary tract infection)     Past Surgical History:  Procedure Laterality Date  . CESAREAN SECTION     1996  . CHOLECYSTECTOMY     2012  . WISDOM TOOTH EXTRACTION      Current Outpatient Medications on File Prior to Visit  Medication Sig Dispense Refill  . buPROPion (WELLBUTRIN XL) 150 MG 24 hr tablet Take 1 tablet (150 mg total) by mouth daily. 30 tablet 1  . Cholecalciferol (VITAMIN D3) 5000 UNITS CAPS Take 5,000 Units by mouth daily.    . cyclobenzaprine (FLEXERIL) 5 MG tablet Take 1 tablet (5 mg total) by mouth 2 (two) times daily as needed for muscle spasms. 60 tablet 3  . gabapentin (NEURONTIN) 300 MG capsule 1 CAPSULE TWICE DAILY 180 capsule 3  . Multiple Vitamin (MULTIVITAMIN) tablet Take 1 tablet by mouth daily.     No current facility-administered medications on file prior to visit.     No Known Allergies  Family History  Problem Relation Age of Onset  . Lung cancer Mother 58       Deceased  . Prostate cancer Father 45       Deceased  . Pancreatic cancer Paternal Uncle   . Healthy Brother        x3  . Healthy Sister        x2  . Menstrual problems Daughter        x1    Social History   Socioeconomic History  . Marital  status: Divorced    Spouse name: Not on file  . Number of children: 1  . Years of education: Not on file  . Highest education level: Not on file  Occupational History  . Not on file  Social Needs  . Financial resource strain: Not on file  . Food insecurity    Worry: Not on file    Inability: Not on file  . Transportation needs    Medical: Not on file    Non-medical: Not on file  Tobacco Use  . Smoking status: Former Smoker    Packs/day: 0.50    Years: 25.00    Pack years: 12.50    Quit date: 07/31/2017    Years since quitting: 1.8  . Smokeless tobacco: Never Used  Substance and Sexual Activity  . Alcohol use: Yes    Alcohol/week: 2.0 - 3.0 standard drinks    Types: 2 - 3 Standard drinks or equivalent per week    Comment: social  . Drug use: No  . Sexual activity: Yes  Lifestyle  . Physical activity    Days per week: Not on file    Minutes per session: Not on file  .  Stress: Not on file  Relationships  . Social Herbalist on phone: Not on file    Gets together: Not on file    Attends religious service: Not on file    Active member of club or organization: Not on file    Attends meetings of clubs or organizations: Not on file    Relationship status: Not on file  . Intimate partner violence    Fear of current or ex partner: Not on file    Emotionally abused: Not on file    Physically abused: Not on file    Forced sexual activity: Not on file  Other Topics Concern  . Not on file  Social History Narrative   Lives with husband in a 2 story home.        Has one daughter.         Unemployed      Education: associates degree.      Right handed   Review of Systems  Constitutional: Negative for fever and weight loss.  HENT: Negative for ear discharge, ear pain, hearing loss and tinnitus.   Eyes: Negative for blurred vision, double vision, photophobia and pain.  Respiratory: Negative for cough and shortness of breath.   Cardiovascular: Negative for chest  pain and palpitations.  Gastrointestinal: Negative for abdominal pain, blood in stool, constipation, diarrhea, heartburn, melena, nausea and vomiting.  Genitourinary: Negative for dysuria, flank pain, frequency, hematuria and urgency.  Musculoskeletal: Negative for falls.  Neurological: Negative for dizziness, loss of consciousness and headaches.  Endo/Heme/Allergies: Negative for environmental allergies.  Psychiatric/Behavioral: Negative for depression, hallucinations, substance abuse and suicidal ideas. The patient is not nervous/anxious and does not have insomnia.    BP 102/80   Pulse 86   Temp 98.4 F (36.9 C) (Skin)   Resp 16   Ht '5\' 4"'$  (1.626 m)   Wt 154 lb (69.9 kg)   SpO2 98%   BMI 26.43 kg/m   Physical Exam Vitals signs reviewed.  HENT:     Head: Normocephalic and atraumatic.     Right Ear: Tympanic membrane, ear canal and external ear normal.     Left Ear: Tympanic membrane, ear canal and external ear normal.     Nose: Nose normal. No mucosal edema.     Mouth/Throat:     Pharynx: Uvula midline. No oropharyngeal exudate or posterior oropharyngeal erythema.  Eyes:     Conjunctiva/sclera: Conjunctivae normal.     Pupils: Pupils are equal, round, and reactive to light.  Neck:     Musculoskeletal: Neck supple.     Thyroid: No thyromegaly.  Cardiovascular:     Rate and Rhythm: Normal rate and regular rhythm.     Heart sounds: Normal heart sounds.  Pulmonary:     Effort: Pulmonary effort is normal. No respiratory distress.     Breath sounds: Normal breath sounds. No wheezing or rales.  Abdominal:     General: Bowel sounds are normal. There is no distension.     Palpations: Abdomen is soft. There is no mass.     Tenderness: There is no abdominal tenderness. There is no guarding or rebound.  Lymphadenopathy:     Cervical: No cervical adenopathy.  Skin:    General: Skin is warm and dry.     Findings: No rash.  Neurological:     Mental Status: She is alert and  oriented to person, place, and time.     Cranial Nerves: No cranial nerve deficit.  Recent Results (from the past 2160 hour(s))  CBC with Differential/Platelet     Status: None   Collection Time: 05/10/19  1:24 PM  Result Value Ref Range   WBC 7.5 4.0 - 10.5 K/uL   RBC 4.08 3.87 - 5.11 Mil/uL   Hemoglobin 13.1 12.0 - 15.0 g/dL   HCT 39.0 36.0 - 46.0 %   MCV 95.7 78.0 - 100.0 fl   MCHC 33.5 30.0 - 36.0 g/dL   RDW 12.9 11.5 - 15.5 %   Platelets 266.0 150.0 - 400.0 K/uL   Neutrophils Relative % 62.6 43.0 - 77.0 %   Lymphocytes Relative 27.5 12.0 - 46.0 %   Monocytes Relative 6.7 3.0 - 12.0 %   Eosinophils Relative 2.0 0.0 - 5.0 %   Basophils Relative 1.2 0.0 - 3.0 %   Neutro Abs 4.7 1.4 - 7.7 K/uL   Lymphs Abs 2.1 0.7 - 4.0 K/uL   Monocytes Absolute 0.5 0.1 - 1.0 K/uL   Eosinophils Absolute 0.1 0.0 - 0.7 K/uL   Basophils Absolute 0.1 0.0 - 0.1 K/uL  Protime-INR ( [facility] ONLY)     Status: Abnormal   Collection Time: 05/10/19  1:24 PM  Result Value Ref Range   INR 1.1 (H) 0.8 - 1.0 ratio   Prothrombin Time 12.6 9.6 - 13.1 sec  Comp Met (CMET)     Status: None   Collection Time: 05/10/19  1:24 PM  Result Value Ref Range   Sodium 141 135 - 145 mEq/L   Potassium 4.2 3.5 - 5.1 mEq/L   Chloride 106 96 - 112 mEq/L   CO2 27 19 - 32 mEq/L   Glucose, Bld 74 70 - 99 mg/dL   BUN 14 6 - 23 mg/dL   Creatinine, Ser 0.85 0.40 - 1.20 mg/dL   Total Bilirubin 0.8 0.2 - 1.2 mg/dL   Alkaline Phosphatase 75 39 - 117 U/L   AST 28 0 - 37 U/L   ALT 27 0 - 35 U/L   Total Protein 6.7 6.0 - 8.3 g/dL   Albumin 4.2 3.5 - 5.2 g/dL   Calcium 9.1 8.4 - 10.5 mg/dL   GFR 69.01 >60.00 mL/min    Assessment/Plan: 1. Visit for preventive health examination Depression screen negative. Health Maintenance reviewed. Preventive schedule discussed and handout given in AVS. Will obtain fasting labs today.  - Lipid Profile - TSH  2. Tobacco use disorder Increase Wellbutrin XL to 300 mg daily. Follow-up  2-3 months.   3. Breast cancer screening Order for screening mammogram placed. Continue follow-ups with GYN.  4. Colon cancer screening Order for screening colonoscopy placed.   Leeanne Rio, PA-C

## 2019-05-20 NOTE — Patient Instructions (Signed)
Please go to the lab for blood work.   Our office will call you with your results unless you have chosen to receive results via MyChart.  If your blood work is normal we will follow-up each year for physicals and as scheduled for chronic medical problems.  If anything is abnormal we will treat accordingly and get you in for a follow-up.   Preventive Care 40-57 Years Old, Female Preventive care refers to visits with your health care provider and lifestyle choices that can promote health and wellness. This includes:  A yearly physical exam. This may also be called an annual well check.  Regular dental visits and eye exams.  Immunizations.  Screening for certain conditions.  Healthy lifestyle choices, such as eating a healthy diet, getting regular exercise, not using drugs or products that contain nicotine and tobacco, and limiting alcohol use. What can I expect for my preventive care visit? Physical exam Your health care provider will check your:  Height and weight. This may be used to calculate body mass index (BMI), which tells if you are at a healthy weight.  Heart rate and blood pressure.  Skin for abnormal spots. Counseling Your health care provider may ask you questions about your:  Alcohol, tobacco, and drug use.  Emotional well-being.  Home and relationship well-being.  Sexual activity.  Eating habits.  Work and work environment.  Method of birth control.  Menstrual cycle.  Pregnancy history. What immunizations do I need?  Influenza (flu) vaccine  This is recommended every year. Tetanus, diphtheria, and pertussis (Tdap) vaccine  You may need a Td booster every 10 years. Varicella (chickenpox) vaccine  You may need this if you have not been vaccinated. Zoster (shingles) vaccine  You may need this after age 60. Measles, mumps, and rubella (MMR) vaccine  You may need at least one dose of MMR if you were born in 1957 or later. You may also need a  second dose. Pneumococcal conjugate (PCV13) vaccine  You may need this if you have certain conditions and were not previously vaccinated. Pneumococcal polysaccharide (PPSV23) vaccine  You may need one or two doses if you smoke cigarettes or if you have certain conditions. Meningococcal conjugate (MenACWY) vaccine  You may need this if you have certain conditions. Hepatitis A vaccine  You may need this if you have certain conditions or if you travel or work in places where you may be exposed to hepatitis A. Hepatitis B vaccine  You may need this if you have certain conditions or if you travel or work in places where you may be exposed to hepatitis B. Haemophilus influenzae type b (Hib) vaccine  You may need this if you have certain conditions. Human papillomavirus (HPV) vaccine  If recommended by your health care provider, you may need three doses over 6 months. You may receive vaccines as individual doses or as more than one vaccine together in one shot (combination vaccines). Talk with your health care provider about the risks and benefits of combination vaccines. What tests do I need? Blood tests  Lipid and cholesterol levels. These may be checked every 5 years, or more frequently if you are over 50 years old.  Hepatitis C test.  Hepatitis B test. Screening  Lung cancer screening. You may have this screening every year starting at age 55 if you have a 30-pack-year history of smoking and currently smoke or have quit within the past 15 years.  Colorectal cancer screening. All adults should have this screening starting   at age 50 and continuing until age 75. Your health care provider may recommend screening at age 45 if you are at increased risk. You will have tests every 1-10 years, depending on your results and the type of screening test.  Diabetes screening. This is done by checking your blood sugar (glucose) after you have not eaten for a while (fasting). You may have this done  every 1-3 years.  Mammogram. This may be done every 1-2 years. Talk with your health care provider about when you should start having regular mammograms. This may depend on whether you have a family history of breast cancer.  BRCA-related cancer screening. This may be done if you have a family history of breast, ovarian, tubal, or peritoneal cancers.  Pelvic exam and Pap test. This may be done every 3 years starting at age 21. Starting at age 30, this may be done every 5 years if you have a Pap test in combination with an HPV test. Other tests  Sexually transmitted disease (STD) testing.  Bone density scan. This is done to screen for osteoporosis. You may have this scan if you are at high risk for osteoporosis. Follow these instructions at home: Eating and drinking  Eat a diet that includes fresh fruits and vegetables, whole grains, lean protein, and low-fat dairy.  Take vitamin and mineral supplements as recommended by your health care provider.  Do not drink alcohol if: ? Your health care provider tells you not to drink. ? You are pregnant, may be pregnant, or are planning to become pregnant.  If you drink alcohol: ? Limit how much you have to 0-1 drink a day. ? Be aware of how much alcohol is in your drink. In the [place] one drink equals one 12 oz bottle of beer (355 mL), one 5 oz glass of wine (148 mL), or one 1 oz glass of hard liquor (44 mL). Lifestyle  Take daily care of your teeth and gums.  Stay active. Exercise for at least 30 minutes on 5 or more days each week.  Do not use any products that contain nicotine or tobacco, such as cigarettes, e-cigarettes, and chewing tobacco. If you need help quitting, ask your health care provider.  If you are sexually active, practice safe sex. Use a condom or other form of birth control (contraception) in order to prevent pregnancy and STIs (sexually transmitted infections).  If told by your health care provider, take low-dose aspirin  daily starting at age 50. What's next?  Visit your health care provider once a year for a well check visit.  Ask your health care provider how often you should have your eyes and teeth checked.  Stay up to date on all vaccines. This information is not intended to replace advice given to you by your health care provider. Make sure you discuss any questions you have with your health care provider. Document Released: 11/13/2015 Document Revised: 06/28/2018 Document Reviewed: 06/28/2018 Elsevier Patient Education  2020 [employer] .      

## 2019-05-23 DIAGNOSIS — Z96651 Presence of right artificial knee joint: Secondary | ICD-10-CM | POA: Diagnosis not present

## 2019-05-23 DIAGNOSIS — M1612 Unilateral primary osteoarthritis, left hip: Secondary | ICD-10-CM | POA: Diagnosis not present

## 2019-06-24 ENCOUNTER — Other Ambulatory Visit: Payer: Self-pay | Admitting: Physician Assistant

## 2019-06-24 DIAGNOSIS — F17200 Nicotine dependence, unspecified, uncomplicated: Secondary | ICD-10-CM

## 2019-07-12 ENCOUNTER — Other Ambulatory Visit: Payer: Self-pay | Admitting: Physician Assistant

## 2019-08-12 ENCOUNTER — Telehealth: Payer: Self-pay | Admitting: Physician Assistant

## 2019-08-12 NOTE — Telephone Encounter (Signed)
Patient called wanted to know if she could come in to get her second shingles shot. Please advise.

## 2019-08-12 NOTE — Telephone Encounter (Signed)
LMOVM for patient to schedule nurse visit for 2nd shingles shot

## 2019-08-13 ENCOUNTER — Other Ambulatory Visit: Payer: Self-pay | Admitting: Neurology

## 2019-08-15 ENCOUNTER — Other Ambulatory Visit: Payer: Self-pay

## 2019-08-15 ENCOUNTER — Ambulatory Visit (INDEPENDENT_AMBULATORY_CARE_PROVIDER_SITE_OTHER): Payer: BLUE CROSS/BLUE SHIELD | Admitting: Emergency Medicine

## 2019-08-15 DIAGNOSIS — Z23 Encounter for immunization: Secondary | ICD-10-CM

## 2019-08-15 NOTE — Progress Notes (Signed)
Shingrix given as directed. CMA note reviewed.  Leeanne Rio, PA-C

## 2019-08-15 NOTE — Progress Notes (Signed)
Regina Griffin is a 58 y.o. female presents to the office today for 2nd Shingrix injections, per physician's orders. Original order: Shingrix Shingrix (med), 0.5 ml (dose),  IM (route) was administered Left Deltoid (location) today. Patient tolerated injection.  Leonidas Romberg

## 2019-08-19 ENCOUNTER — Encounter: Payer: Self-pay | Admitting: Physician Assistant

## 2019-08-19 NOTE — Telephone Encounter (Signed)
Received documents from last colonoscopy 01/11/2013. She is due for repeat colonoscopy. Please let her know this and go ahead and place referral to GI here (last specialist in [place]) for screening colonoscopy.

## 2019-08-19 NOTE — Telephone Encounter (Signed)
Colonoscopy results faxed to GI. Patients chart has been updated and GI will reach out to patient to schedule a follow up

## 2019-09-03 ENCOUNTER — Telehealth: Payer: Self-pay | Admitting: Gastroenterology

## 2019-09-03 NOTE — Telephone Encounter (Signed)
DOD May 20, 2019  Dr. Loletha Carrow, there is a referral to schedule Mrs. Regina Griffin for a colonoscopy.  She had a colonoscopy in 2014 in [place].  Colon and path reports will be sent to you for review.  Please advise scheduling.

## 2019-09-04 NOTE — Telephone Encounter (Addendum)
This case came to me through front desk staff.  (For documentation purposes only:   Screening colonoscopy 01/01/2013 with Dr. Cathe Mons at [facility] in [place], [place] Complete exam to cecum with reported good preparation. Internal hemorrhoids 19mm and 6mm hyperplastic polyps removed with hot snare and hot Bx) ____________________________________________________________   Based on the records, since the polyps were not the precancerous type, current guidelines recommend next colonoscopy at a 10 year interval  - 12/2022.  If she would like to have [place] put her in for recall at the appropriate time, please do so.

## 2019-09-05 ENCOUNTER — Telehealth: Payer: Self-pay

## 2019-09-05 NOTE — Telephone Encounter (Signed)
Left a detailed msg to inform patient. Recall has also been placed for 2024.

## 2019-09-05 NOTE — Telephone Encounter (Signed)
Patient called in stating that [facility] contacted her and told her that since her polyps were non cancerous is 2014, she is not due for another colonoscopy until 2024.

## 2019-09-06 NOTE — Telephone Encounter (Signed)
noted 

## 2019-11-09 DIAGNOSIS — A084 Viral intestinal infection, unspecified: Secondary | ICD-10-CM | POA: Diagnosis not present

## 2019-12-24 ENCOUNTER — Other Ambulatory Visit: Payer: Self-pay | Admitting: Neurology

## 2020-01-18 ENCOUNTER — Other Ambulatory Visit: Payer: Self-pay | Admitting: Physician Assistant

## 2020-04-27 ENCOUNTER — Other Ambulatory Visit: Payer: Self-pay | Admitting: Neurology

## 2020-04-28 ENCOUNTER — Telehealth: Payer: Self-pay | Admitting: Neurology

## 2020-04-28 ENCOUNTER — Other Ambulatory Visit: Payer: Self-pay

## 2020-04-28 MED ORDER — GABAPENTIN 300 MG PO CAPS: ORAL_CAPSULE | ORAL | 0 refills | Status: DC

## 2020-04-28 NOTE — Telephone Encounter (Signed)
Patient called and scheduled follow up with Dr. Allena Katz on 08/03/20. She is requesting refills on Gabapentin 300 MG to last until then.  [facility]

## 2020-04-28 NOTE — Telephone Encounter (Signed)
Refill approved for 1 month/no refills to cover until Dr Allena Katz returns to the office.

## 2020-05-01 DIAGNOSIS — H26493 Other secondary cataract, bilateral: Secondary | ICD-10-CM | POA: Diagnosis not present

## 2020-05-27 DIAGNOSIS — M1612 Unilateral primary osteoarthritis, left hip: Secondary | ICD-10-CM | POA: Diagnosis not present

## 2020-06-08 ENCOUNTER — Telehealth: Payer: Self-pay | Admitting: Neurology

## 2020-06-08 DIAGNOSIS — G35 Multiple sclerosis: Secondary | ICD-10-CM

## 2020-06-08 MED ORDER — GABAPENTIN 300 MG PO CAPS: ORAL_CAPSULE | ORAL | 1 refills | Status: DC

## 2020-06-08 NOTE — Telephone Encounter (Signed)
[facility] [address] [facility] in [place].  Patient states she usually has MRI's done prior to her appointments. She is requesting this be done sooner than right before her appointment. She is having issues with her left leg and her neuropathy is worse. Please call.

## 2020-06-08 NOTE — Telephone Encounter (Signed)
Left VM requesting call back. Refill for gabapentin sent per pt request to cover until 08/03/20 appt.

## 2020-06-09 ENCOUNTER — Other Ambulatory Visit: Payer: Self-pay | Admitting: Internal Medicine

## 2020-06-09 ENCOUNTER — Telehealth: Payer: Self-pay

## 2020-06-09 NOTE — Telephone Encounter (Signed)
[facility] calling pt to schedule for MRI brain/cervical spine/thoracic spine

## 2020-06-09 NOTE — Telephone Encounter (Signed)
Updated insurance info sent to [place] for MRI PA  [place] ID # [id number] Group [id number] Start 02/29/2020

## 2020-06-09 NOTE — Telephone Encounter (Signed)
Please order MRI brain, cervical spine, and thoracic spine with and without contrast. Thanks.

## 2020-07-01 ENCOUNTER — Other Ambulatory Visit: Payer: Self-pay

## 2020-07-03 ENCOUNTER — Ambulatory Visit
Admission: RE | Admit: 2020-07-03 | Discharge: 2020-07-03 | Disposition: A | Discharge status: Home / Self Care | Payer: BC Managed Care – PPO | Source: Ambulatory Visit | Attending: Neurology | Admitting: Neurology

## 2020-07-03 DIAGNOSIS — G35 Multiple sclerosis: Secondary | ICD-10-CM

## 2020-07-03 MED ORDER — GADOBENATE DIMEGLUMINE 529 MG/ML IV SOLN
13.0000 mL | Freq: Once | INTRAVENOUS | Status: AC | PRN
  Administered 2020-07-03: 13 mL via INTRAVENOUS

## 2020-07-05 ENCOUNTER — Other Ambulatory Visit: Payer: Self-pay | Admitting: Physician Assistant

## 2020-07-12 ENCOUNTER — Ambulatory Visit
Admission: RE | Admit: 2020-07-12 | Discharge: 2020-07-12 | Disposition: A | Discharge status: Home / Self Care | Payer: BC Managed Care – PPO | Source: Ambulatory Visit | Attending: Neurology | Admitting: Neurology

## 2020-07-12 ENCOUNTER — Other Ambulatory Visit: Payer: Self-pay

## 2020-07-12 DIAGNOSIS — G35 Multiple sclerosis: Secondary | ICD-10-CM | POA: Diagnosis not present

## 2020-07-12 DIAGNOSIS — R200 Anesthesia of skin: Secondary | ICD-10-CM | POA: Diagnosis not present

## 2020-07-12 DIAGNOSIS — M5124 Other intervertebral disc displacement, thoracic region: Secondary | ICD-10-CM | POA: Diagnosis not present

## 2020-07-12 DIAGNOSIS — R531 Weakness: Secondary | ICD-10-CM | POA: Diagnosis not present

## 2020-07-12 MED ORDER — GADOBENATE DIMEGLUMINE 529 MG/ML IV SOLN
14.0000 mL | Freq: Once | INTRAVENOUS | Status: AC | PRN
  Administered 2020-07-12: 14 mL via INTRAVENOUS

## 2020-07-23 ENCOUNTER — Ambulatory Visit
Admission: RE | Admit: 2020-07-23 | Discharge: 2020-07-23 | Disposition: A | Discharge status: Home / Self Care | Payer: BC Managed Care – PPO | Source: Ambulatory Visit | Attending: Neurology | Admitting: Neurology

## 2020-07-23 ENCOUNTER — Other Ambulatory Visit: Payer: Self-pay

## 2020-07-23 DIAGNOSIS — M50323 Other cervical disc degeneration at C6-C7 level: Secondary | ICD-10-CM | POA: Diagnosis not present

## 2020-07-23 DIAGNOSIS — G35 Multiple sclerosis: Secondary | ICD-10-CM

## 2020-07-23 DIAGNOSIS — M4802 Spinal stenosis, cervical region: Secondary | ICD-10-CM | POA: Diagnosis not present

## 2020-07-23 DIAGNOSIS — M5021 Other cervical disc displacement,  high cervical region: Secondary | ICD-10-CM | POA: Diagnosis not present

## 2020-07-23 DIAGNOSIS — M50221 Other cervical disc displacement at C4-C5 level: Secondary | ICD-10-CM | POA: Diagnosis not present

## 2020-07-23 MED ORDER — GADOBENATE DIMEGLUMINE 529 MG/ML IV SOLN
13.0000 mL | Freq: Once | INTRAVENOUS | Status: AC | PRN
  Administered 2020-07-23: 13 mL via INTRAVENOUS

## 2020-07-31 ENCOUNTER — Encounter: Payer: Self-pay | Admitting: Physician Assistant

## 2020-07-31 ENCOUNTER — Other Ambulatory Visit: Payer: Self-pay

## 2020-07-31 ENCOUNTER — Other Ambulatory Visit (HOSPITAL_COMMUNITY)
Admission: RE | Admit: 2020-07-31 | Discharge: 2020-07-31 | Disposition: A | Discharge status: Home / Self Care | Payer: BC Managed Care – PPO | Source: Ambulatory Visit | Attending: Physician Assistant | Admitting: Physician Assistant

## 2020-07-31 ENCOUNTER — Ambulatory Visit (INDEPENDENT_AMBULATORY_CARE_PROVIDER_SITE_OTHER): Payer: BC Managed Care – PPO | Admitting: Physician Assistant

## 2020-07-31 VITALS — BP 116/68 | HR 84 | Temp 98.1°F | Resp 20 | Wt 141.2 lb

## 2020-07-31 DIAGNOSIS — Z124 Encounter for screening for malignant neoplasm of cervix: Secondary | ICD-10-CM | POA: Diagnosis not present

## 2020-07-31 DIAGNOSIS — Z0001 Encounter for general adult medical examination with abnormal findings: Secondary | ICD-10-CM

## 2020-07-31 DIAGNOSIS — M79605 Pain in left leg: Secondary | ICD-10-CM

## 2020-07-31 DIAGNOSIS — Z23 Encounter for immunization: Secondary | ICD-10-CM | POA: Diagnosis not present

## 2020-07-31 DIAGNOSIS — Z0000 Encounter for general adult medical examination without abnormal findings: Secondary | ICD-10-CM

## 2020-07-31 DIAGNOSIS — Z1231 Encounter for screening mammogram for malignant neoplasm of breast: Secondary | ICD-10-CM | POA: Diagnosis not present

## 2020-07-31 LAB — CBC WITH DIFFERENTIAL/PLATELET
Basophils Absolute: 0.1 10*3/uL (ref 0.0–0.1)
Basophils Relative: 0.9 % (ref 0.0–3.0)
Eosinophils Absolute: 0.7 10*3/uL (ref 0.0–0.7)
Eosinophils Relative: 11.8 % — ABNORMAL HIGH (ref 0.0–5.0)
HCT: 39.7 % (ref 36.0–46.0)
Hemoglobin: 13.3 g/dL (ref 12.0–15.0)
Lymphocytes Relative: 35.3 % (ref 12.0–46.0)
Lymphs Abs: 2.2 10*3/uL (ref 0.7–4.0)
MCHC: 33.5 g/dL (ref 30.0–36.0)
MCV: 96.5 fl (ref 78.0–100.0)
Monocytes Absolute: 0.5 10*3/uL (ref 0.1–1.0)
Monocytes Relative: 7.8 % (ref 3.0–12.0)
Neutro Abs: 2.7 10*3/uL (ref 1.4–7.7)
Neutrophils Relative %: 44.2 % (ref 43.0–77.0)
Platelets: 240 10*3/uL (ref 150.0–400.0)
RBC: 4.11 Mil/uL (ref 3.87–5.11)
RDW: 12.8 % (ref 11.5–15.5)
WBC: 6.1 10*3/uL (ref 4.0–10.5)

## 2020-07-31 LAB — LIPID PANEL
Cholesterol: 172 mg/dL (ref 0–200)
HDL: 68.7 mg/dL (ref >39.00)
LDL Cholesterol: 90 mg/dL (ref 0–99)
NonHDL: 103.08
Total CHOL/HDL Ratio: 3
Triglycerides: 65 mg/dL (ref 0.0–149.0)
VLDL: 13 mg/dL (ref 0.0–40.0)

## 2020-07-31 LAB — COMPREHENSIVE METABOLIC PANEL
ALT: 20 U/L (ref 0–35)
AST: 23 U/L (ref 0–37)
Albumin: 4.3 g/dL (ref 3.5–5.2)
Alkaline Phosphatase: 60 U/L (ref 39–117)
BUN: 12 mg/dL (ref 6–23)
CO2: 28 mEq/L (ref 19–32)
Calcium: 9.5 mg/dL (ref 8.4–10.5)
Chloride: 106 mEq/L (ref 96–112)
Creatinine, Ser: 0.84 mg/dL (ref 0.40–1.20)
GFR: 69.66 mL/min (ref >60.00)
Glucose, Bld: 79 mg/dL (ref 70–99)
Potassium: 4 mEq/L (ref 3.5–5.1)
Sodium: 141 mEq/L (ref 135–145)
Total Bilirubin: 0.7 mg/dL (ref 0.2–1.2)
Total Protein: 6.9 g/dL (ref 6.0–8.3)

## 2020-07-31 LAB — TSH: TSH: 1.3 u[IU]/mL (ref 0.35–4.50)

## 2020-07-31 LAB — HEMOGLOBIN A1C: Hgb A1c MFr Bld: 5.6 % (ref 4.6–6.5)

## 2020-07-31 NOTE — Progress Notes (Signed)
Patient presents to clinic today for annual exam.  Patient is fasting for labs.  Acute Concerns: Patient endorsing ongoing discomfort in her left anterior thigh since having her total hip arthroplasty last year.  States she recently had a 1 year follow-up with Dr. Eulah Pont at which time they thought she might have mild bursitis.  Was given an corticosteroid injection into the bursa.  Notes that pain resolved but she has continued having anterior thigh pain which she describes as a significant tension.  Notes discomfort with range of motion of her lower leg.  Denies any bruising, swelling, numbness or tingling.  Health Maintenance: Immunizations --tetanus up-to-date.  Has had Covid vaccine.  Agrees to flu shot today. Colon Cancer Screening --up-to-date Mammogram --due for repeat.  Order placed. PAP --Due.  Asymptomatic.  Has remote history of HPV without recent issue.  Agrees to have done today in office  Past Medical History:  Diagnosis Date  . Colon polyps    Benign  . History of chicken pox   . HPV in female   . Shingles   . Thyroid disease    Nodules 2014, benign  . UTI (lower urinary tract infection)     Past Surgical History:  Procedure Laterality Date  . CESAREAN SECTION     1996  . CHOLECYSTECTOMY     2012  . WISDOM TOOTH EXTRACTION      Current Outpatient Medications on File Prior to Visit  Medication Sig Dispense Refill  . buPROPion (WELLBUTRIN XL) 300 MG 24 hr tablet TAKE 1 TABLET BY MOUTH EVERY DAY 90 tablet 1  . Cholecalciferol (VITAMIN D3) 5000 UNITS CAPS Take 5,000 Units by mouth daily.    . cyclobenzaprine (FLEXERIL) 5 MG tablet TAKE 1 TABLET (5 MG TOTAL) BY MOUTH 2 (TWO) TIMES DAILY AS NEEDED FOR MUSCLE SPASMS. 60 tablet 5  . gabapentin (NEURONTIN) 300 MG capsule 1 CAPSULE TWICE DAILY 60 capsule 1  . Multiple Vitamin (MULTIVITAMIN) tablet Take 1 tablet by mouth daily.     No current facility-administered medications on file prior to visit.    No Known  Allergies  Family History  Problem Relation Age of Onset  . Lung cancer Mother 41       Deceased  . Prostate cancer Father 4       Deceased  . Pancreatic cancer Paternal Uncle   . Healthy Brother        x3  . Healthy Sister        x2  . Menstrual problems Daughter        x1    Social History   Socioeconomic History  . Marital status: Divorced    Spouse name: Not on file  . Number of children: 1  . Years of education: Not on file  . Highest education level: Not on file  Occupational History  . Not on file  Tobacco Use  . Smoking status: Former Smoker    Packs/day: 0.50    Years: 25.00    Pack years: 12.50    Quit date: 07/31/2017    Years since quitting: 3.0  . Smokeless tobacco: Never Used  Vaping Use  . Vaping Use: Never used  Substance and Sexual Activity  . Alcohol use: Yes    Alcohol/week: 2.0 - 3.0 standard drinks    Types: 2 - 3 Standard drinks or equivalent per week    Comment: social  . Drug use: No  . Sexual activity: Yes  Other Topics Concern  .  Not on file  Social History Narrative   Lives with husband in a 2 story home.        Has one daughter.         Unemployed      Education: associates degree.      Right handed   Social Determinants of Health   Financial Resource Strain:   . Difficulty of Paying Living Expenses: Not on file  Food Insecurity:   . Worried About Programme researcher, broadcasting/film/video in the Last Year: Not on file  . Ran Out of Food in the Last Year: Not on file  Transportation Needs:   . Lack of Transportation (Medical): Not on file  . Lack of Transportation (Non-Medical): Not on file  Physical Activity:   . Days of Exercise per Week: Not on file  . Minutes of Exercise per Session: Not on file  Stress:   . Feeling of Stress : Not on file  Social Connections:   . Frequency of Communication with Friends and Family: Not on file  . Frequency of Social Gatherings with Friends and Family: Not on file  . Attends Religious Services: Not on  file  . Active Member of Clubs or Organizations: Not on file  . Attends Banker Meetings: Not on file  . Marital Status: Not on file  Intimate Partner Violence:   . Fear of Current or Ex-Partner: Not on file  . Emotionally Abused: Not on file  . Physically Abused: Not on file  . Sexually Abused: Not on file    Review of Systems  Constitutional: Negative for fever and weight loss.  HENT: Negative for ear discharge, ear pain, hearing loss and tinnitus.   Eyes: Negative for blurred vision, double vision, photophobia and pain.  Respiratory: Negative for cough and shortness of breath.   Cardiovascular: Negative for chest pain and palpitations.  Gastrointestinal: Negative for abdominal pain, blood in stool, constipation, diarrhea, heartburn, melena, nausea and vomiting.  Genitourinary: Negative for dysuria, flank pain, frequency, hematuria and urgency.  Musculoskeletal: Negative for falls.  Neurological: Negative for dizziness, loss of consciousness and headaches.  Endo/Heme/Allergies: Negative for environmental allergies.  Psychiatric/Behavioral: Negative for depression, hallucinations, substance abuse and suicidal ideas. The patient is not nervous/anxious and does not have insomnia.    BP 116/68   Pulse 84   Temp 98.1 F (36.7 C) (Temporal)   Resp 20   Wt 141 lb 3.2 oz (64 kg)   SpO2 98%   BMI 24.24 kg/m   Physical Exam Vitals reviewed.  HENT:     Head: Normocephalic and atraumatic.     Right Ear: Tympanic membrane, ear canal and external ear normal.     Left Ear: Tympanic membrane, ear canal and external ear normal.     Nose: Nose normal. No mucosal edema.     Mouth/Throat:     Pharynx: Uvula midline. No oropharyngeal exudate or posterior oropharyngeal erythema.  Eyes:     Conjunctiva/sclera: Conjunctivae normal.     Pupils: Pupils are equal, round, and reactive to light.  Neck:     Thyroid: No thyromegaly.  Cardiovascular:     Rate and Rhythm: Normal rate  and regular rhythm.     Heart sounds: Normal heart sounds.  Pulmonary:     Effort: Pulmonary effort is normal. No respiratory distress.     Breath sounds: Normal breath sounds. No wheezing or rales.  Abdominal:     General: Bowel sounds are normal. There is no distension.  Palpations: Abdomen is soft. There is no mass.     Tenderness: There is no abdominal tenderness. There is no guarding or rebound.  Musculoskeletal:     Cervical back: Neck supple.  Lymphadenopathy:     Cervical: No cervical adenopathy.  Skin:    General: Skin is warm and dry.     Findings: No rash.  Neurological:     Mental Status: She is alert and oriented to person, place, and time.     Cranial Nerves: No cranial nerve deficit.    Assessment/Plan: 1. Visit for preventive health examination Depression screen negative. Health Maintenance reviewed. Preventive schedule discussed and handout given in AVS. Will obtain fasting labs today.  - CBC with Differential/Platelet - Comprehensive metabolic panel - Hemoglobin A1c - Lipid panel - TSH  2. Encounter for screening mammogram for malignant neoplasm of breast Order for screening mammogram placed. - MM DIGITAL SCREENING BILATERAL; Future  3. Cervical cancer screening Pelvic examination completed today without worrisome findings.  Pap with HPV cotesting sent to lab. - Cytology - PAP( [facility])  4. Need for immunization against influenza - Flu Vaccine QUAD 36+ mos IM  5. Left leg pain Ongoing.  Examination today reveals significant tension.  We will set her up with our physical therapist for further evaluation and consideration for exercises, TENS, dry needling etc.  Also encourage patient to reach back out to her orthopedist in case they would like to do further assessment. - Ambulatory referral to Physical Therapy    This visit occurred during the SARS-CoV-2 public health emergency.  Safety protocols were in place, including screening questions  prior to the visit, additional usage of staff PPE, and extensive cleaning of exam room while observing appropriate contact time as indicated for disinfecting solutions.    Piedad Climes, PA-C

## 2020-07-31 NOTE — Patient Instructions (Signed)
Please go to the lab for blood work.   Our office will call you with your results unless you have chosen to receive results via MyChart.  If your blood work is normal we will follow-up each year for physicals and as scheduled for chronic medical problems.  If anything is abnormal we will treat accordingly and get you in for a follow-up.  I am setting you up with Physical Therapy. If you do not hear from them within 1 week, please let me know.   Preventive Care 61-29 Years Old, Female Preventive care refers to visits with your health care provider and lifestyle choices that can promote health and wellness. This includes:  A yearly physical exam. This may also be called an annual well check.  Regular dental visits and eye exams.  Immunizations.  Screening for certain conditions.  Healthy lifestyle choices, such as eating a healthy diet, getting regular exercise, not using drugs or products that contain nicotine and tobacco, and limiting alcohol use. What can I expect for my preventive care visit? Physical exam Your health care provider will check your:  Height and weight. This may be used to calculate body mass index (BMI), which tells if you are at a healthy weight.  Heart rate and blood pressure.  Skin for abnormal spots. Counseling Your health care provider may ask you questions about your:  Alcohol, tobacco, and drug use.  Emotional well-being.  Home and relationship well-being.  Sexual activity.  Eating habits.  Work and work Statistician.  Method of birth control.  Menstrual cycle.  Pregnancy history. What immunizations do I need?  Influenza (flu) vaccine  This is recommended every year. Tetanus, diphtheria, and pertussis (Tdap) vaccine  You may need a Td booster every 10 years. Varicella (chickenpox) vaccine  You may need this if you have not been vaccinated. Zoster (shingles) vaccine  You may need this after age 48. Measles, mumps, and rubella  (MMR) vaccine  You may need at least one dose of MMR if you were born in 1957 or later. You may also need a second dose. Pneumococcal conjugate (PCV13) vaccine  You may need this if you have certain conditions and were not previously vaccinated. Pneumococcal polysaccharide (PPSV23) vaccine  You may need one or two doses if you smoke cigarettes or if you have certain conditions. Meningococcal conjugate (MenACWY) vaccine  You may need this if you have certain conditions. Hepatitis A vaccine  You may need this if you have certain conditions or if you travel or work in places where you may be exposed to hepatitis A. Hepatitis B vaccine  You may need this if you have certain conditions or if you travel or work in places where you may be exposed to hepatitis B. Haemophilus influenzae type b (Hib) vaccine  You may need this if you have certain conditions. Human papillomavirus (HPV) vaccine  If recommended by your health care provider, you may need three doses over 6 months. You may receive vaccines as individual doses or as more than one vaccine together in one shot (combination vaccines). Talk with your health care provider about the risks and benefits of combination vaccines. What tests do I need? Blood tests  Lipid and cholesterol levels. These may be checked every 5 years, or more frequently if you are over 66 years old.  Hepatitis C test.  Hepatitis B test. Screening  Lung cancer screening. You may have this screening every year starting at age 74 if you have a 30-pack-year history of  smoking and currently smoke or have quit within the past 15 years.  Colorectal cancer screening. All adults should have this screening starting at age 32 and continuing until age 25. Your health care provider may recommend screening at age 18 if you are at increased risk. You will have tests every 1-10 years, depending on your results and the type of screening test.  Diabetes screening. This is done  by checking your blood sugar (glucose) after you have not eaten for a while (fasting). You may have this done every 1-3 years.  Mammogram. This may be done every 1-2 years. Talk with your health care provider about when you should start having regular mammograms. This may depend on whether you have a family history of breast cancer.  BRCA-related cancer screening. This may be done if you have a family history of breast, ovarian, tubal, or peritoneal cancers.  Pelvic exam and Pap test. This may be done every 3 years starting at age 29. Starting at age 9, this may be done every 5 years if you have a Pap test in combination with an HPV test. Other tests  Sexually transmitted disease (STD) testing.  Bone density scan. This is done to screen for osteoporosis. You may have this scan if you are at high risk for osteoporosis. Follow these instructions at home: Eating and drinking  Eat a diet that includes fresh fruits and vegetables, whole grains, lean protein, and low-fat dairy.  Take vitamin and mineral supplements as recommended by your health care provider.  Do not drink alcohol if: ? Your health care provider tells you not to drink. ? You are pregnant, may be pregnant, or are planning to become pregnant.  If you drink alcohol: ? Limit how much you have to 0-1 drink a day. ? Be aware of how much alcohol is in your drink. In the [place] one drink equals one 12 oz bottle of beer (355 mL), one 5 oz glass of wine (148 mL), or one 1 oz glass of hard liquor (44 mL). Lifestyle  Take daily care of your teeth and gums.  Stay active. Exercise for at least 30 minutes on 5 or more days each week.  Do not use any products that contain nicotine or tobacco, such as cigarettes, e-cigarettes, and chewing tobacco. If you need help quitting, ask your health care provider.  If you are sexually active, practice safe sex. Use a condom or other form of birth control (contraception) in order to prevent  pregnancy and STIs (sexually transmitted infections).  If told by your health care provider, take low-dose aspirin daily starting at age 69. What's next?  Visit your health care provider once a year for a well check visit.  Ask your health care provider how often you should have your eyes and teeth checked.  Stay up to date on all vaccines. This information is not intended to replace advice given to you by your health care provider. Make sure you discuss any questions you have with your health care provider. Document Revised: 06/28/2018 Document Reviewed: 06/28/2018 Elsevier Patient Education  2020 [employer].

## 2020-08-03 ENCOUNTER — Other Ambulatory Visit (INDEPENDENT_AMBULATORY_CARE_PROVIDER_SITE_OTHER): Payer: BC Managed Care – PPO

## 2020-08-03 ENCOUNTER — Ambulatory Visit (INDEPENDENT_AMBULATORY_CARE_PROVIDER_SITE_OTHER): Payer: BC Managed Care – PPO | Admitting: Neurology

## 2020-08-03 ENCOUNTER — Other Ambulatory Visit: Payer: Self-pay

## 2020-08-03 ENCOUNTER — Encounter: Payer: Self-pay | Admitting: Neurology

## 2020-08-03 VITALS — BP 133/85 | HR 93 | Ht 64.0 in | Wt 141.0 lb

## 2020-08-03 DIAGNOSIS — Z5181 Encounter for therapeutic drug level monitoring: Secondary | ICD-10-CM | POA: Diagnosis not present

## 2020-08-03 DIAGNOSIS — G35 Multiple sclerosis: Secondary | ICD-10-CM

## 2020-08-03 MED ORDER — BACLOFEN 10 MG PO TABS: ORAL_TABLET | ORAL | 0 refills | Status: DC

## 2020-08-03 MED ORDER — PANTOPRAZOLE SODIUM 40 MG PO TBEC: 40.0000 mg | DELAYED_RELEASE_TABLET | Freq: Every day | ORAL | 3 refills | Status: DC

## 2020-08-03 MED ORDER — GABAPENTIN 300 MG PO CAPS: ORAL_CAPSULE | ORAL | 3 refills | Status: AC

## 2020-08-03 NOTE — Progress Notes (Signed)
Follow-up Visit   Date: 08/03/20   Regina Griffin MRN: [id number] DOB: 1961-07-23   Interim History: Regina Griffin is a 59 y.o. right-handed Caucasian female with chronic back pain and tobacco use returning to the clinic for follow-up of relapsing remitting multiple sclerosis.  The patient was accompanied to the clinic by self.  She is here for follow-up visit and denies any new neurological complaints.  Recent surveillance imaging shows a new, non-enhancing thoracic lesion, at T4-5 as well as previously seen at T2, T3-4, T6-7, and T8.  No lesions in the cervical spine and stable disease in the brain.   She continues to have left thigh spasms and cramps.  She has difficulty completely extending the left hip and will be starting physical therapy soon.  She does not appreciate any improvement with flexeril.   Medications:  Current Outpatient Medications on File Prior to Visit  Medication Sig Dispense Refill  . buPROPion (WELLBUTRIN XL) 300 MG 24 hr tablet TAKE 1 TABLET BY MOUTH EVERY DAY 90 tablet 1  . Cholecalciferol (VITAMIN D3) 5000 UNITS CAPS Take 5,000 Units by mouth daily.    . cyclobenzaprine (FLEXERIL) 5 MG tablet TAKE 1 TABLET (5 MG TOTAL) BY MOUTH 2 (TWO) TIMES DAILY AS NEEDED FOR MUSCLE SPASMS. 60 tablet 5  . gabapentin (NEURONTIN) 300 MG capsule 1 CAPSULE TWICE DAILY 60 capsule 1  . Multiple Vitamin (MULTIVITAMIN) tablet Take 1 tablet by mouth daily.    . pantoprazole (PROTONIX) 40 MG tablet Take 1 tablet (40 mg total) by mouth daily. 30 tablet 3   No current facility-administered medications on file prior to visit.    Allergies: No Known Allergies  Vital Signs:  BP 133/85   Pulse 93   Ht 5\' 4"  (1.626 m)   Wt 141 lb (64 kg)   SpO2 98%   BMI 24.20 kg/m   Neurological Exam: MENTAL STATUS including orientation to time, place, person, recent and remote memory, attention span and concentration, language, and fund of knowledge is normal.  Speech is not  dysarthric.  CRANIAL NERVES:  No visual field defects.  Pupils equal round and reactive to light.  Normal conjugate, extra-ocular eye movements in all directions of gaze.  No ptosis.  Face is symmetric. Palate elevates symmetrically.  Tongue is midline.  MOTOR:  Motor strength is 5/5 in all extremities.  No atrophy, fasciculations or abnormal movements.  No pronator drift.  Tone is normal.    MSRs:                                           Right        Left brachioradialis 3+  2+  biceps 3+  2+  triceps 3+  2+  patellar 3+  3+  ankle jerk 2+  2+   COORDINATION/GAIT:  Normal finger-to- nose-finger.  Intact rapid alternating movements bilaterally.  Gait is mildly stooped, stable and unassisted   Data: MRI brain wwo contrast 07/04/2020: Unchanged cerebral white matter disease consistent with multiple sclerosis. No evidence of active demyelination.  MRI cervical spine wwo contrast 9/24/201: 1. No visible cord demyelination. 2. Multilevel cervical degeneration with narrowings described above.  MRI thoracic spine wwo contrast 07/13/2020: 1. Small focal cord lesions at T2, T6-7, and T8 stable from prior exam. Additional focal lesion at T3-4 is suspected to have been present on prior exam, although is more  conspicuous on today's study. An additional focal lesion at T4-5 is not definitely seen on prior, and suspected to be new, suggesting mild disease progression. No evidence for active demyelination. 2. Multilevel disc protrusions throughout the thoracic spine as above, not significantly changed from previous.  IMPRESSION/PLAN: 1. Relapsing-remitting multiple sclerosis manifesting with bilateral leg numbness and weakness (12/2014). Clinically she denies any new neurological complaints.  Repeat surveillance imaging shows stable disease in the brain, none in the cervical spine, and one new lesion in the thoracic spine. Patient has been reluctant to start DMT in the past, but expresses interest  today.  With her cord involvement and probability of increased functional disability, I will start her on Tysabri.  She was informed of the risks and benefits, including rare risk of PML.  All questions answered.   - Start PT for Tysabri  - Check JC virus antibody.  CBC and CMP reviewed from 10/4  - CBC and CMP is normal  2. Left leg muscle spasms  - Start baclofen 5mg  at bedtime and increase to 10mg  at bedtime  - D/C flexeril  - Start PT as ordered by PCP  Return to clinic in 6 months   Thank you for allowing me to participate in patient's care.  If I can answer any additional questions, I would be pleased to do so.    Sincerely,    Jaeliana Lococo K. , DO

## 2020-08-03 NOTE — Patient Instructions (Addendum)
Start prior authorization for Tysabri   Check labs  Start baclofen - take 5mg  (half-tab) at bedtime x 1 week, then increase to 10mg  (1 tablet) at bedtime  Continue gabapentin 300mg  twice daily  Return to clinic in 6 months

## 2020-08-04 ENCOUNTER — Encounter: Payer: Self-pay | Admitting: Neurology

## 2020-08-04 ENCOUNTER — Other Ambulatory Visit: Payer: Self-pay

## 2020-08-04 ENCOUNTER — Encounter: Payer: BC Managed Care – PPO | Admitting: Physician Assistant

## 2020-08-04 NOTE — Progress Notes (Addendum)
Regina Griffin (Key: Cathie Hoops) Tysabri 300MG /15ML concentrate   Denied due to not trying and failing one of the following drugs: Extavia, Gilenya, Tecfidera, Glatiramer.   Instructions from Dr. 06-30-1995: we can try one of the oral medications. Please start PA for tecfidera.

## 2020-08-07 LAB — STRATIFY JCV AB (W/ INDEX) W/ RFLX
Index Value: 0.09
Stratify JCV (TM) Ab w/Reflex Inhibition: NEGATIVE

## 2020-08-10 LAB — CYTOLOGY - PAP
Comment: NEGATIVE
Comment: NEGATIVE
Diagnosis: NEGATIVE
HPV 16: NEGATIVE
HPV 18 / 45: POSITIVE — AB
High risk HPV: POSITIVE — AB

## 2020-08-11 ENCOUNTER — Telehealth: Payer: Self-pay | Admitting: Neurology

## 2020-08-11 ENCOUNTER — Encounter: Payer: Self-pay | Admitting: Neurology

## 2020-08-11 NOTE — Telephone Encounter (Signed)
Please send script for [facility] in [place]. Thanks!

## 2020-08-11 NOTE — Progress Notes (Addendum)
Regina Griffin (Key: [id number]) Tecfidera Starter Pack 120 & 240 mg dr capsules Form [employer] [place] Electronic Prior Authorization Request Form [phone] NCPDP) Created 4 minutes ago Sent to Plan 2 minutes ago Plan Response 1 minute ago Submit Clinical Questions less than a minute ago Determination Wait for Determination Please wait for [facility] 2017 to return a determination.   Regina Griffin (Key: Britt Bolognese) Tecfidera 240 mg dr capsules Form [employer] [place] Electronic Prior Authorization Request Form [phone] NCPDP) Created 3 minutes ago Sent to Plan 2 minutes ago Plan Response 2 minutes ago Submit Clinical Questions less than a minute ago Determination Wait for Determination Please wait for [facility] 2017 to return a determination.

## 2020-08-11 NOTE — Telephone Encounter (Signed)
We will need to send start form to specialty pharmacy.

## 2020-08-12 ENCOUNTER — Other Ambulatory Visit: Payer: Self-pay

## 2020-08-12 DIAGNOSIS — R87618 Other abnormal cytological findings on specimens from cervix uteri: Secondary | ICD-10-CM

## 2020-08-12 NOTE — Telephone Encounter (Signed)
Called and spoke to [place] at [employer]. Asked if patient has to sign start form. I was informed that patient does need to sign form. Wynona Canes explained that it speeds up the process for patients. I was also informed that I can send it without patients signature and they will reach out and have patient sign digitally, but that can take alittle longer.   Called patient and informed her of her options of signing or not. Patient stated she can't come to office anytime soon and would like me to send the form in without her signature. Patient stated she isn't in a hurry to start the medication and is actually still thinking about if she wants to do it or not. Patient is ok with me sending form in without signature and is ok if the process is delayed.   Form has been completed and faxed.

## 2020-08-13 NOTE — Telephone Encounter (Signed)
Received a fax back from [facility] and was informed patient will need to sign.   Called patient 08/13/2020 at 3:52pm and left a message for her to return my call.

## 2020-08-14 NOTE — Progress Notes (Addendum)
Received denial for both Tecfideras through cover my meds. Faxed paperwork 08/13/20 for appeal to insurance. Awaiting determination.   Received denial for appeals- per insurance patient needs to try generic form of tecfidera first. The only way patient can get approved for Tecfidera is if she has allergies to the generic that are not related to brand name.   Sent Dr. Allena Katz and msg and she is okay with patient trying generic Tecfidera. Will ask Neysa Bonito about this on Monday.

## 2020-08-16 ENCOUNTER — Other Ambulatory Visit: Payer: Self-pay | Admitting: Physician Assistant

## 2020-08-17 ENCOUNTER — Telehealth: Payer: Self-pay | Admitting: Neurology

## 2020-08-17 MED ORDER — DIMETHYL FUMARATE 240 MG PO CPDR: DELAYED_RELEASE_CAPSULE | ORAL | 11 refills | Status: DC

## 2020-08-17 MED ORDER — DIMETHYL FUMARATE 120 MG PO CPDR: DELAYED_RELEASE_CAPSULE | ORAL | 0 refills | Status: DC

## 2020-08-17 NOTE — Telephone Encounter (Signed)
Please send in prescription for the Generic form of Tecfidera with directions. Thanks!

## 2020-08-17 NOTE — Telephone Encounter (Signed)
[facility] Tecfidera 120mg  BID x 7 days, then increase to Tecfidera 240mg  BID and continue. Thanks

## 2020-08-18 DIAGNOSIS — Z96642 Presence of left artificial hip joint: Secondary | ICD-10-CM | POA: Diagnosis not present

## 2020-08-18 DIAGNOSIS — G35 Multiple sclerosis: Secondary | ICD-10-CM | POA: Diagnosis not present

## 2020-08-18 DIAGNOSIS — Z79899 Other long term (current) drug therapy: Secondary | ICD-10-CM | POA: Diagnosis not present

## 2020-08-20 ENCOUNTER — Other Ambulatory Visit: Payer: Self-pay

## 2020-08-20 ENCOUNTER — Encounter: Payer: Self-pay | Admitting: Obstetrics and Gynecology

## 2020-08-20 ENCOUNTER — Ambulatory Visit: Payer: BC Managed Care – PPO | Admitting: Physical Therapy

## 2020-08-20 ENCOUNTER — Ambulatory Visit (INDEPENDENT_AMBULATORY_CARE_PROVIDER_SITE_OTHER): Payer: BC Managed Care – PPO | Admitting: Obstetrics and Gynecology

## 2020-08-20 ENCOUNTER — Other Ambulatory Visit (HOSPITAL_COMMUNITY)
Admission: RE | Admit: 2020-08-20 | Discharge: 2020-08-20 | Disposition: A | Discharge status: Home / Self Care | Payer: BC Managed Care – PPO | Source: Ambulatory Visit | Attending: Obstetrics and Gynecology | Admitting: Obstetrics and Gynecology

## 2020-08-20 VITALS — BP 122/74 | HR 105 | Ht 63.75 in | Wt 138.0 lb

## 2020-08-20 DIAGNOSIS — A630 Anogenital (venereal) warts: Secondary | ICD-10-CM | POA: Diagnosis not present

## 2020-08-20 DIAGNOSIS — N870 Mild cervical dysplasia: Secondary | ICD-10-CM | POA: Diagnosis not present

## 2020-08-20 DIAGNOSIS — N890 Mild vaginal dysplasia: Secondary | ICD-10-CM | POA: Diagnosis not present

## 2020-08-20 DIAGNOSIS — Z113 Encounter for screening for infections with a predominantly sexual mode of transmission: Secondary | ICD-10-CM | POA: Diagnosis not present

## 2020-08-20 NOTE — Progress Notes (Signed)
59 y.o. G33P1011 Divorced White or Caucasian Not Hispanic or Latino female here for a consultation from Marcelline Mates, PA-C for evaluation of an abnormal pap smear. Pap from earlier this month returned as negative for intraepithelial lesion, but + HPV 18/45.  She has had abnormal paps in the past and has had cryosurgery x 2. Last time was more than 25 years ago. She had normal paps after the cryo, then [place] hpv in ~[place] ([place] Last pap here was in 11/7 and was negative with negative HPV.  Sexually active, same partner x 1 year.   H/O MS, no current symptoms.     No LMP recorded. Patient is postmenopausal.          Sexually active: Yes.    The current method of family planning is post menopausal status.    Exercising: Yes.    Rowing  Smoker:  no  Health Maintenance: Pap: 07/31/20 WNL HR HPV +18/45 History of abnormal Pap:  yes MMG:  11/07/17 Bi-rads 1 neg  BMD:   None  Colonoscopy: 2014 polyps f/u10 years  TDaP:  08/26/16 Gardasil: NA   reports that she quit smoking about 3 years ago. She has a 12.50 pack-year smoking history. She has never used smokeless tobacco. She reports current alcohol use of about 2.0 - 3.0 standard drinks of alcohol per week. She reports that she does not use drugs.She works in [employer]. Her daughter is going to [employer] taking some classes, will likely go to law school. She already has her bachelors degree.   Past Medical History:  Diagnosis Date  . Colon polyps    Benign  . History of chicken pox   . HPV in female   . Shingles   . Thyroid disease    Nodules 2014, benign  . UTI (lower urinary tract infection)     Past Surgical History:  Procedure Laterality Date  . CESAREAN SECTION     1996  . CHOLECYSTECTOMY     2012  . WISDOM TOOTH EXTRACTION      Current Outpatient Medications  Medication Sig Dispense Refill  . baclofen (LIORESAL) 10 MG tablet Take 5mg  (half-tab) at bedtime x 1 week, then increase to 10mg  (1 tablet) at bedtime 30  each 0  . buPROPion (WELLBUTRIN XL) 300 MG 24 hr tablet TAKE 1 TABLET BY MOUTH EVERY DAY 90 tablet 1  . Cholecalciferol (VITAMIN D3) 5000 UNITS CAPS Take 5,000 Units by mouth daily.    gabapentin (NEURONTIN) 300 MG capsule 1 CAPSULE TWICE DAILY 180 capsule 3  . Multiple Vitamin (MULTIVITAMIN) tablet Take 1 tablet by mouth daily.    . pantoprazole (PROTONIX) 40 MG tablet Take 1 tablet (40 mg total) by mouth daily. 30 tablet 3   No current facility-administered medications for this visit.    Family History  Problem Relation Age of Onset  . Lung cancer Mother 65       Deceased  . Prostate cancer Father 16       Deceased  . Pancreatic cancer Paternal Uncle   . Healthy Brother        x3  . Healthy Sister        x2  . Menstrual problems Daughter        x1    Review of Systems  All other systems reviewed and are negative.   Exam:   BP 122/74   Pulse (!) 105   Ht 5' 3.75" (1.619 m)   Wt 138 lb (  62.6 kg)   SpO2 98%   BMI 23.87 kg/m   Weight change: @WEIGHTCHANGE @ Height:   Height: 5' 3.75" (161.9 cm)  Ht Readings from Last 3 Encounters:  08/20/20 5' 3.75" (1.619 m)  08/03/20 5\' 4"  (1.626 m)  05/20/19 5\' 4"  (1.626 m)    General appearance: alert, cooperative and appears stated age   Pelvic: External genitalia:  no lesions              Urethra:  normal appearing urethra with no masses, tenderness or lesions              Bartholins and Skenes: normal                 Vagina: atrophic appearing vagina with normal color and discharge, no lesions              Cervix: no lesions and mildly stenotic   Colposcopy: not satisfactory, no aceto-white changes. Lugols examination of the cervix and vagina. There are several small area's of decreased lugols uptake on the right vaginal wall, one on the left. 2 biopsies taken at 9 o'clock (sent together). Needed to place a tenaculum at 12 o'clock in order to get the endocervical curette into the cervix, ECC done.                 chaperoned for the exam.  A:  Pap negative with +HPV 18/45. Counseled the patient on HPV.   New partner in the last year  P:   Colposcopy with biopsies and ECC done, further plans depending on results  Nuswab for GC/CT, she declines other STD testing  CC: , PA-C Note sent

## 2020-08-20 NOTE — Patient Instructions (Signed)

## 2020-08-21 LAB — GC/CHLAMYDIA PROBE AMP
Chlamydia trachomatis, NAA: NEGATIVE
Neisseria Gonorrhoeae by PCR: NEGATIVE

## 2020-08-24 LAB — SURGICAL PATHOLOGY

## 2020-08-25 ENCOUNTER — Other Ambulatory Visit: Payer: Self-pay

## 2020-08-25 ENCOUNTER — Ambulatory Visit
Admission: RE | Admit: 2020-08-25 | Discharge: 2020-08-25 | Disposition: A | Discharge status: Home / Self Care | Payer: BC Managed Care – PPO | Source: Ambulatory Visit | Attending: Physician Assistant | Admitting: Physician Assistant

## 2020-08-25 DIAGNOSIS — Z1231 Encounter for screening mammogram for malignant neoplasm of breast: Secondary | ICD-10-CM | POA: Diagnosis not present

## 2020-08-26 ENCOUNTER — Other Ambulatory Visit: Payer: Self-pay | Admitting: Neurology

## 2020-08-27 ENCOUNTER — Encounter: Payer: Self-pay | Admitting: Emergency Medicine

## 2020-09-01 IMAGING — MR MRI CERVICAL SPINE WITHOUT AND WITH CONTRAST
4 of 8 series · 21 of 48 positions shown · IV contrast (multihance)
Comparison: MRI cervical spine 09/19/2016

CLINICAL DATA: Multiple sclerosis

EXAM:
MRI CERVICAL SPINE WITHOUT AND WITH CONTRAST
TECHNIQUE: Multiplanar and multiecho pulse sequences of the cervical spine, to
include the craniocervical junction and cervicothoracic junction,
were obtained without and with intravenous contrast.
CONTRAST:  15mL MULTIHANCE GADOBENATE DIMEGLUMINE 529 MG/ML IV SOLN

[Series 2: T2 · sagittal · 3.0mm · 0.41mm/px · 4 of 16 slices shown (1 of 3)]
[im 1/16]
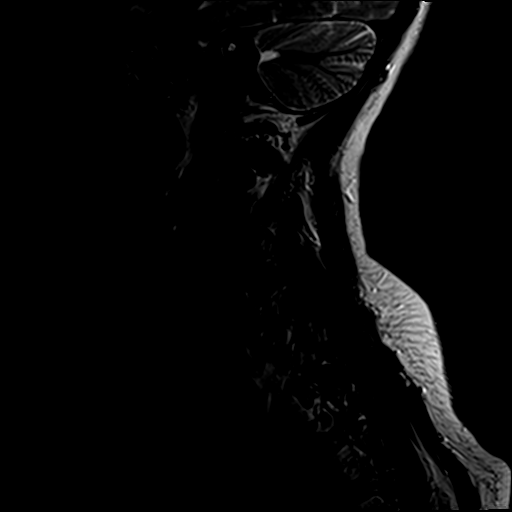
[im 6/16]
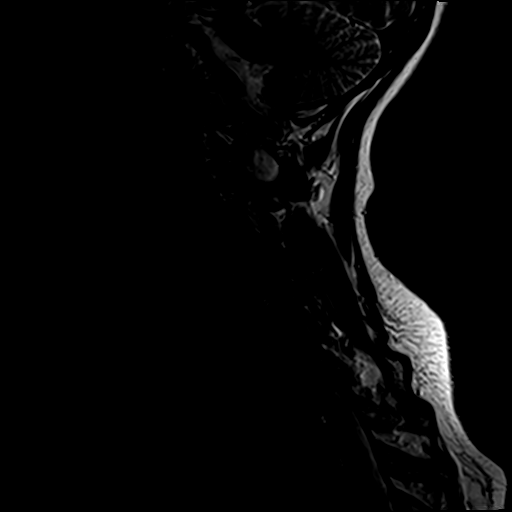
[im 11/16]
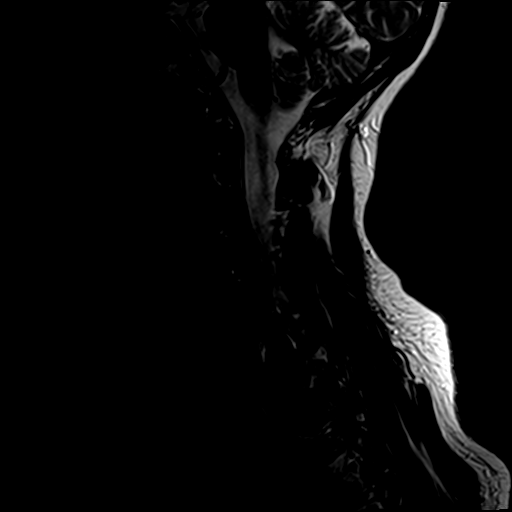
[im 16/16]
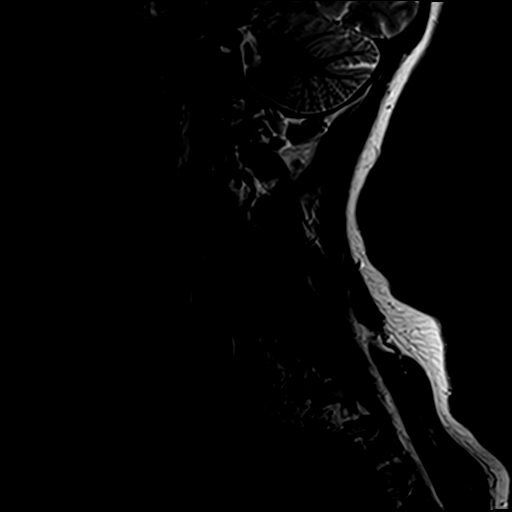

[Series 3: T1 · sagittal · 3.0mm · 0.41mm/px · 3 of 16 slices shown]
[im 1/16]
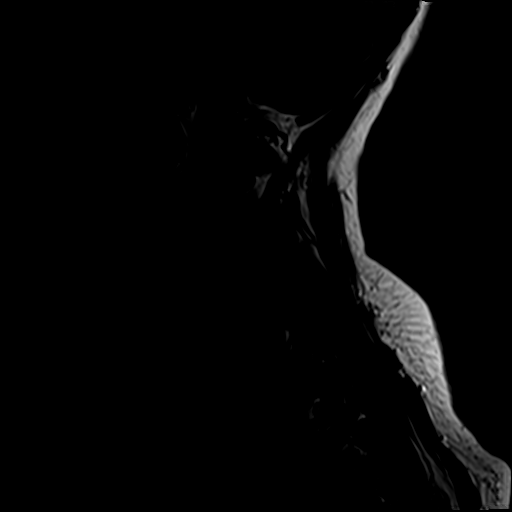
[im 11/16]
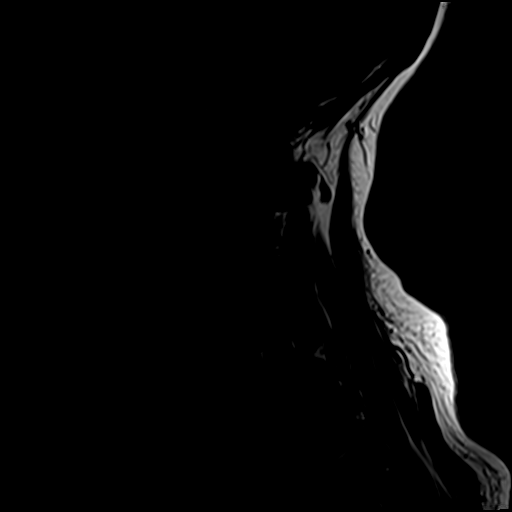
[im 16/16]
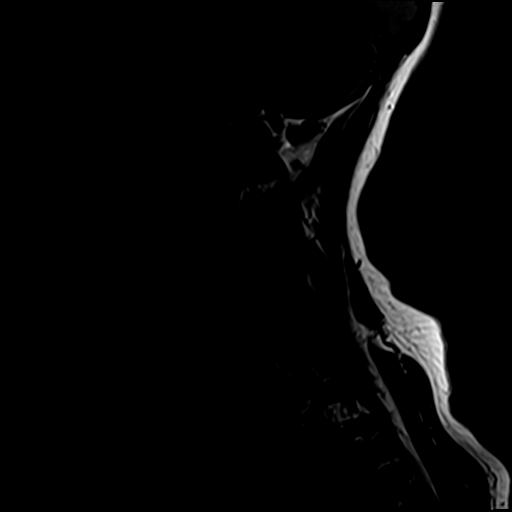

[Series 5: T2 · axial · 3.0mm · 0.39mm/px · z∈[-65,+28]mm · 8 of 27 slices shown (2 of 3)]
[im 1/27]
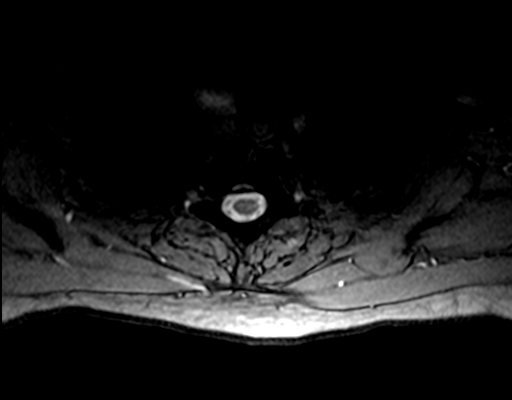
[im 4/27]
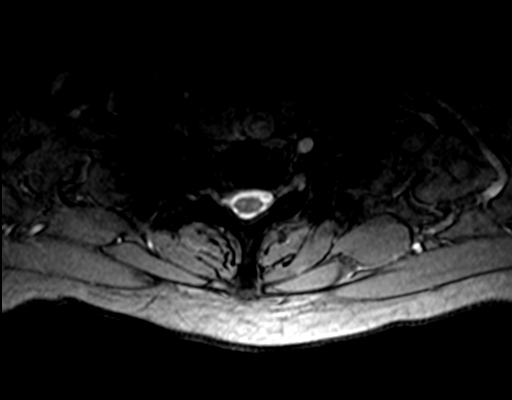
[im 8/27]
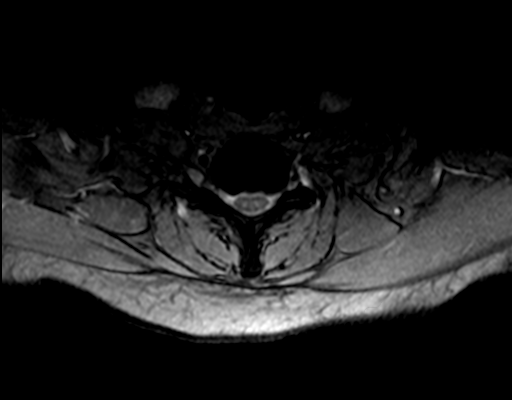
[im 12/27]
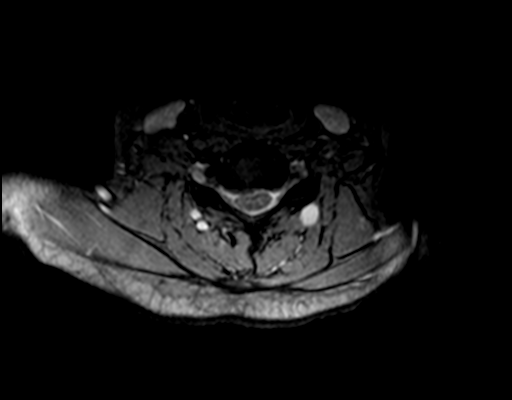
[im 15/27]
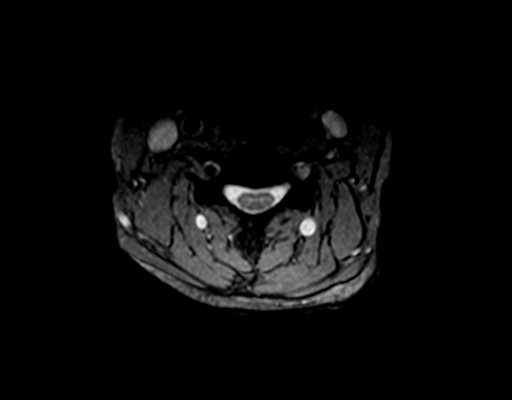
[im 19/27]
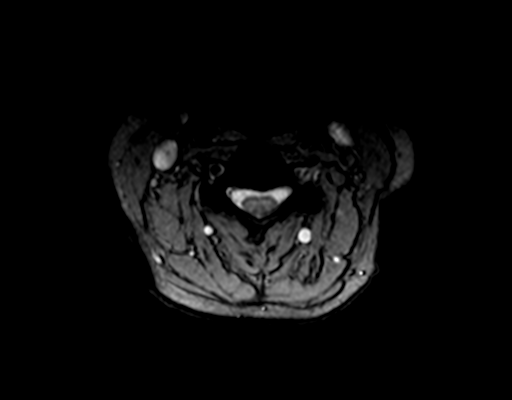
[im 23/27]
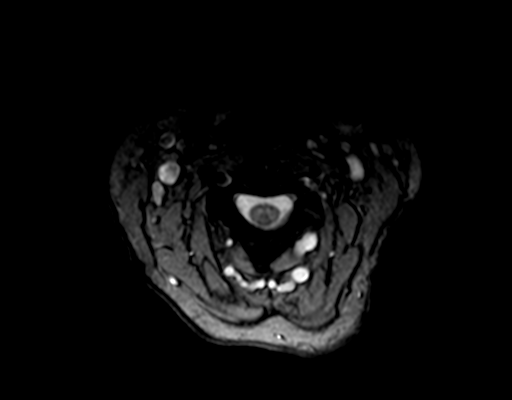
[im 27/27]
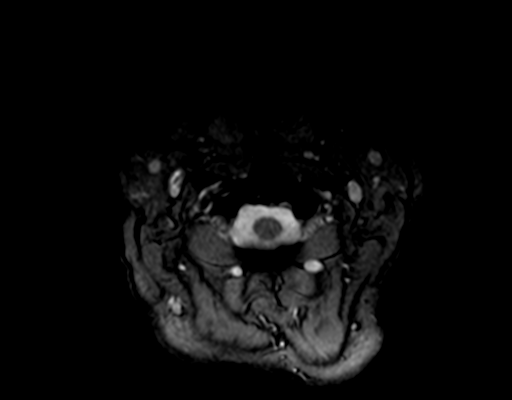

[Series 6: T2 · axial · 3.0mm · 0.39mm/px · z∈[-66,+13]mm · 6 of 27 slices shown (3 of 3)]
[im 1/27]
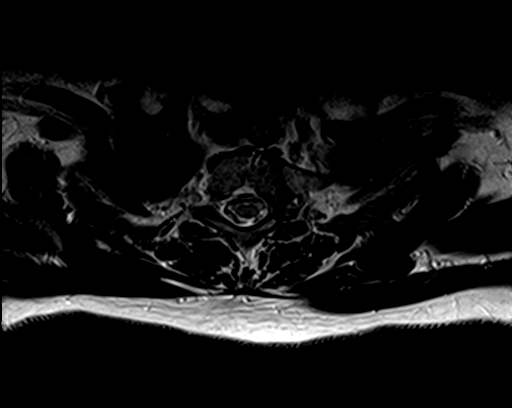
[im 4/27]
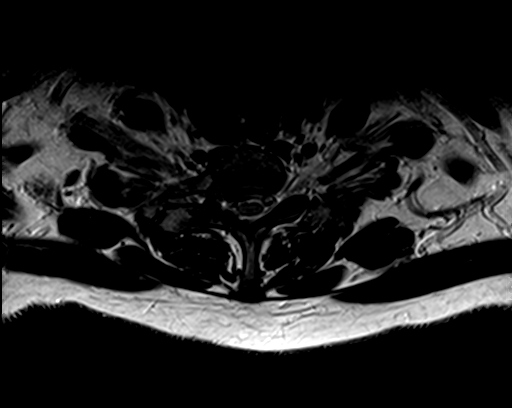
[im 8/27]
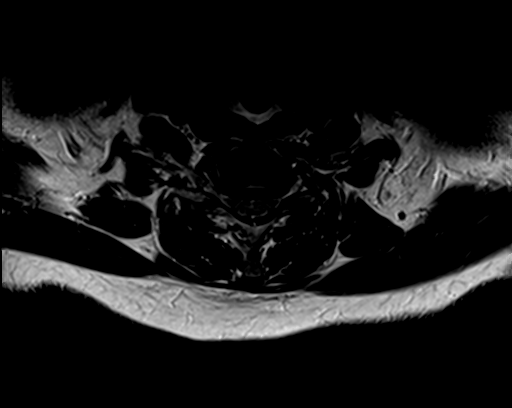
[im 12/27]
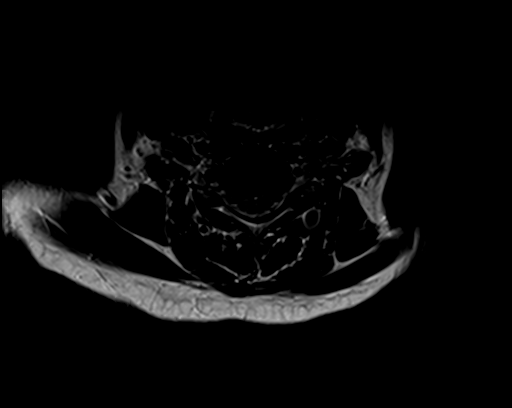
[im 15/27]
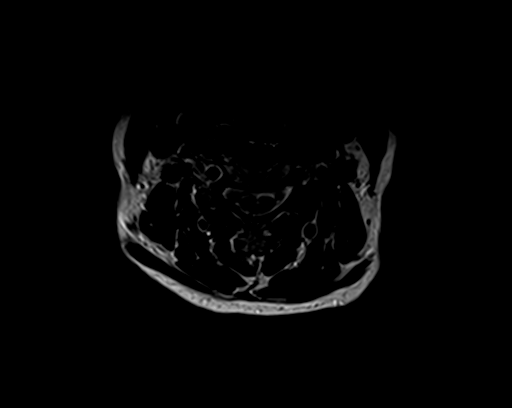
[im 23/27]
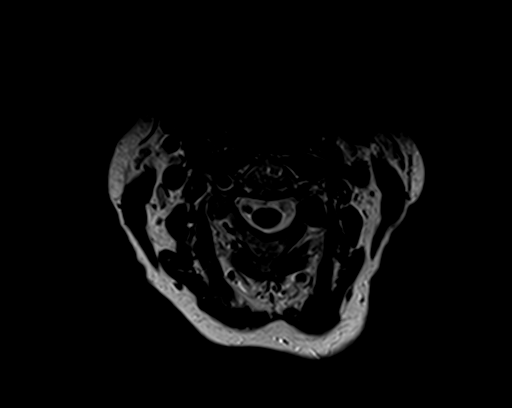

[21 of 48 positions shown; findings below may reference images not displayed]

FINDINGS: Alignment: Mild retrolisthesis C3-4 and C4-5. Straightening of the
cervical lordosis.

Vertebrae: Negative for fracture or mass. Discogenic changes at C6-7
in the endplates.

Cord: Normal cord signal. No cord lesion or abnormal enhancement.
Negative for cord compression.

Posterior Fossa, vertebral arteries, paraspinal tissues: Right
thyroid nodule incompletely imaged but measuring approximately 29 mm
in diameter. This appears larger compared with 6473 and biopsy
recommended.

Disc levels:

C2-3: Negative

C3-4: Mild disc and facet degeneration with spurring. Moderate right
foraminal encroachment and mild left foraminal encroachment
unchanged from the prior study.

C4-5: Disc degeneration and spondylosis. Diffuse uncinate spurring.
Mild spinal stenosis and mild foraminal stenosis bilaterally
unchanged from the prior study

C5-6: Disc degeneration and spurring bilaterally. Cord flattening
right greater than left has progressed. Mild to moderate foraminal
stenosis bilaterally

C6-7: Disc degeneration and spondylosis with diffuse uncinate
spurring. Mild spinal stenosis and mild to moderate foraminal
stenosis bilaterally unchanged.

C7-T1: Moderate right foraminal stenosis due to spurring with
progression from the prior study.
IMPRESSION: Multilevel degenerative change in the cervical spine causing spinal
and foraminal stenosis as above.

Progressive spurring and cord flattening on the right at C6-7

Progressive moderate right foraminal stenosis at C7-T1

29 mm right thyroid nodule with enlargement since the prior study.
Recommend thyroid ultrasound and biopsy.

## 2020-09-12 IMAGING — CR DG HIP (WITH OR WITHOUT PELVIS) 2-3V LEFT
2 series · 2 of 2 positions shown · non-contrast
Comparison: No prior.

CLINICAL DATA: Left hip pain.

EXAM:
DG HIP (WITH OR WITHOUT PELVIS) 2-3V LEFT

[t hip ap left]
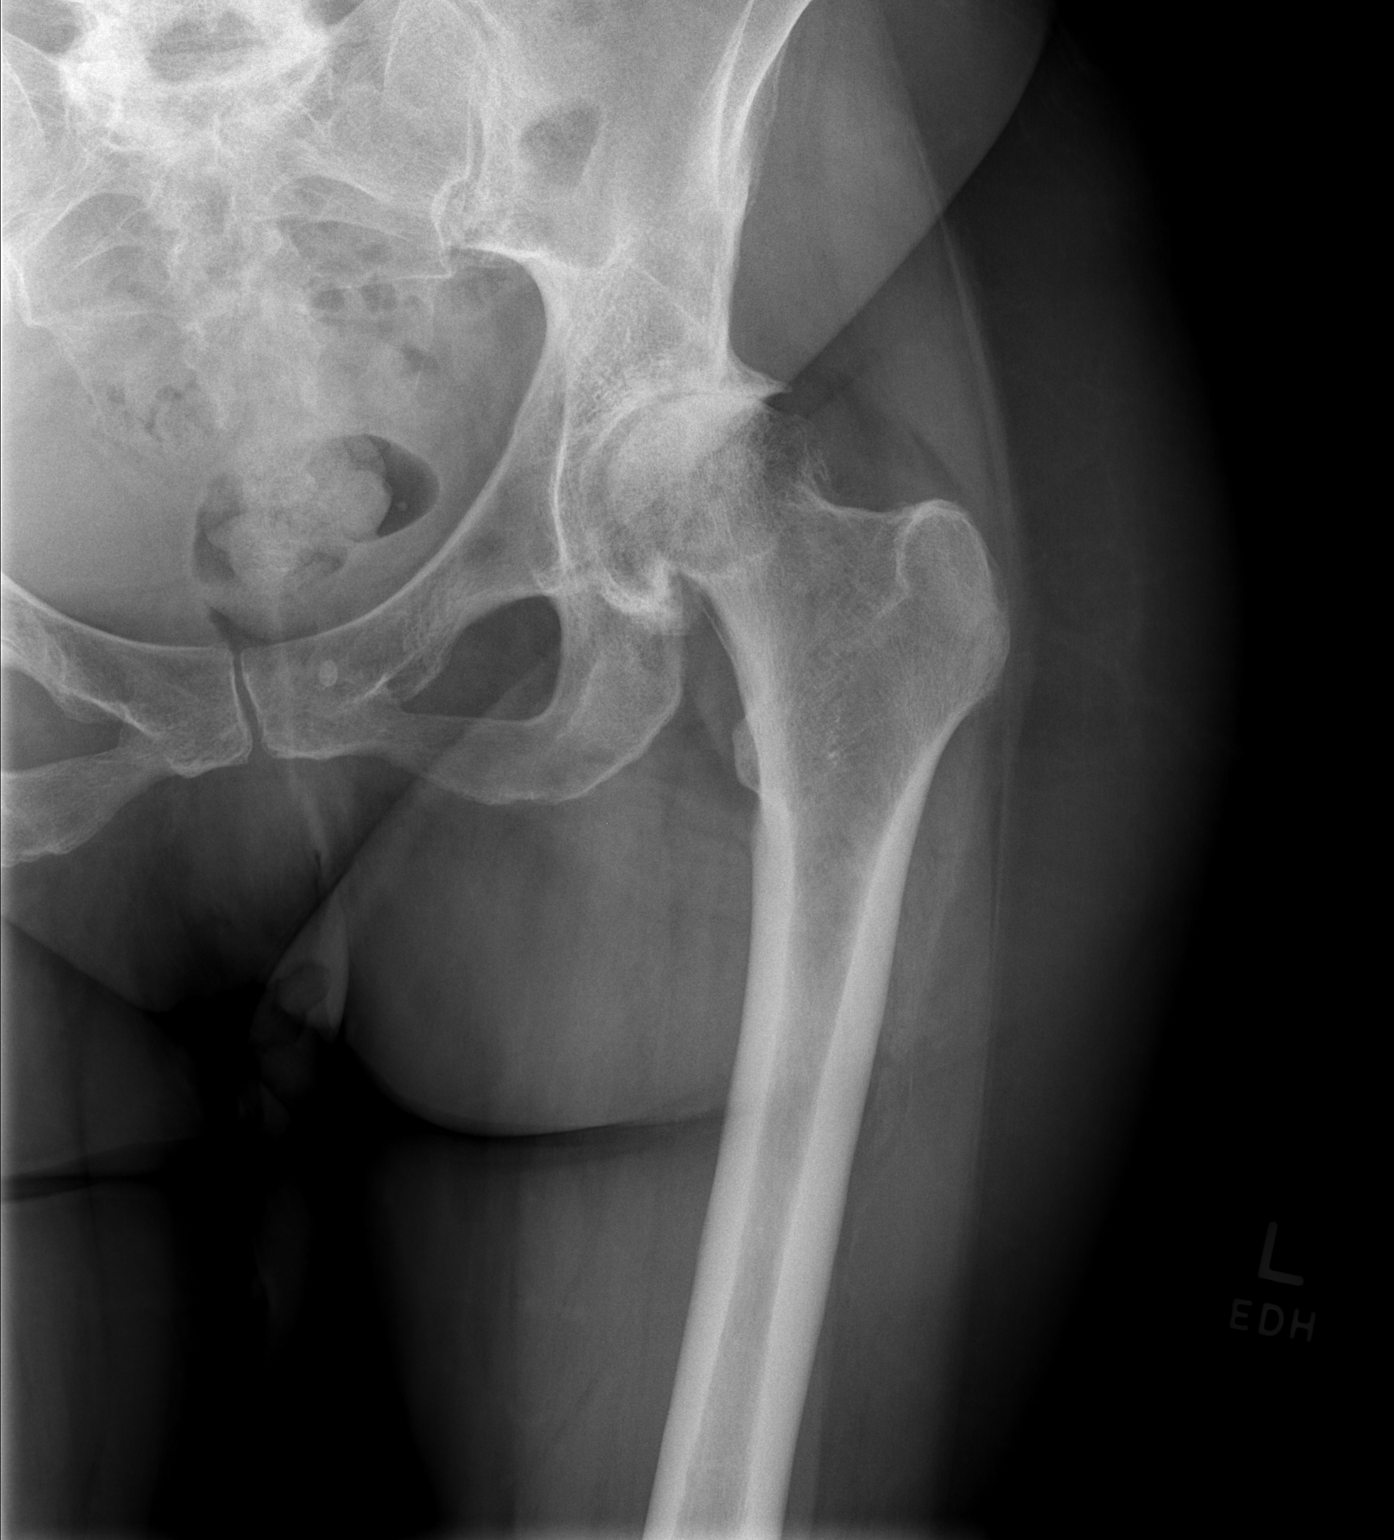

[t hip frog leg left]
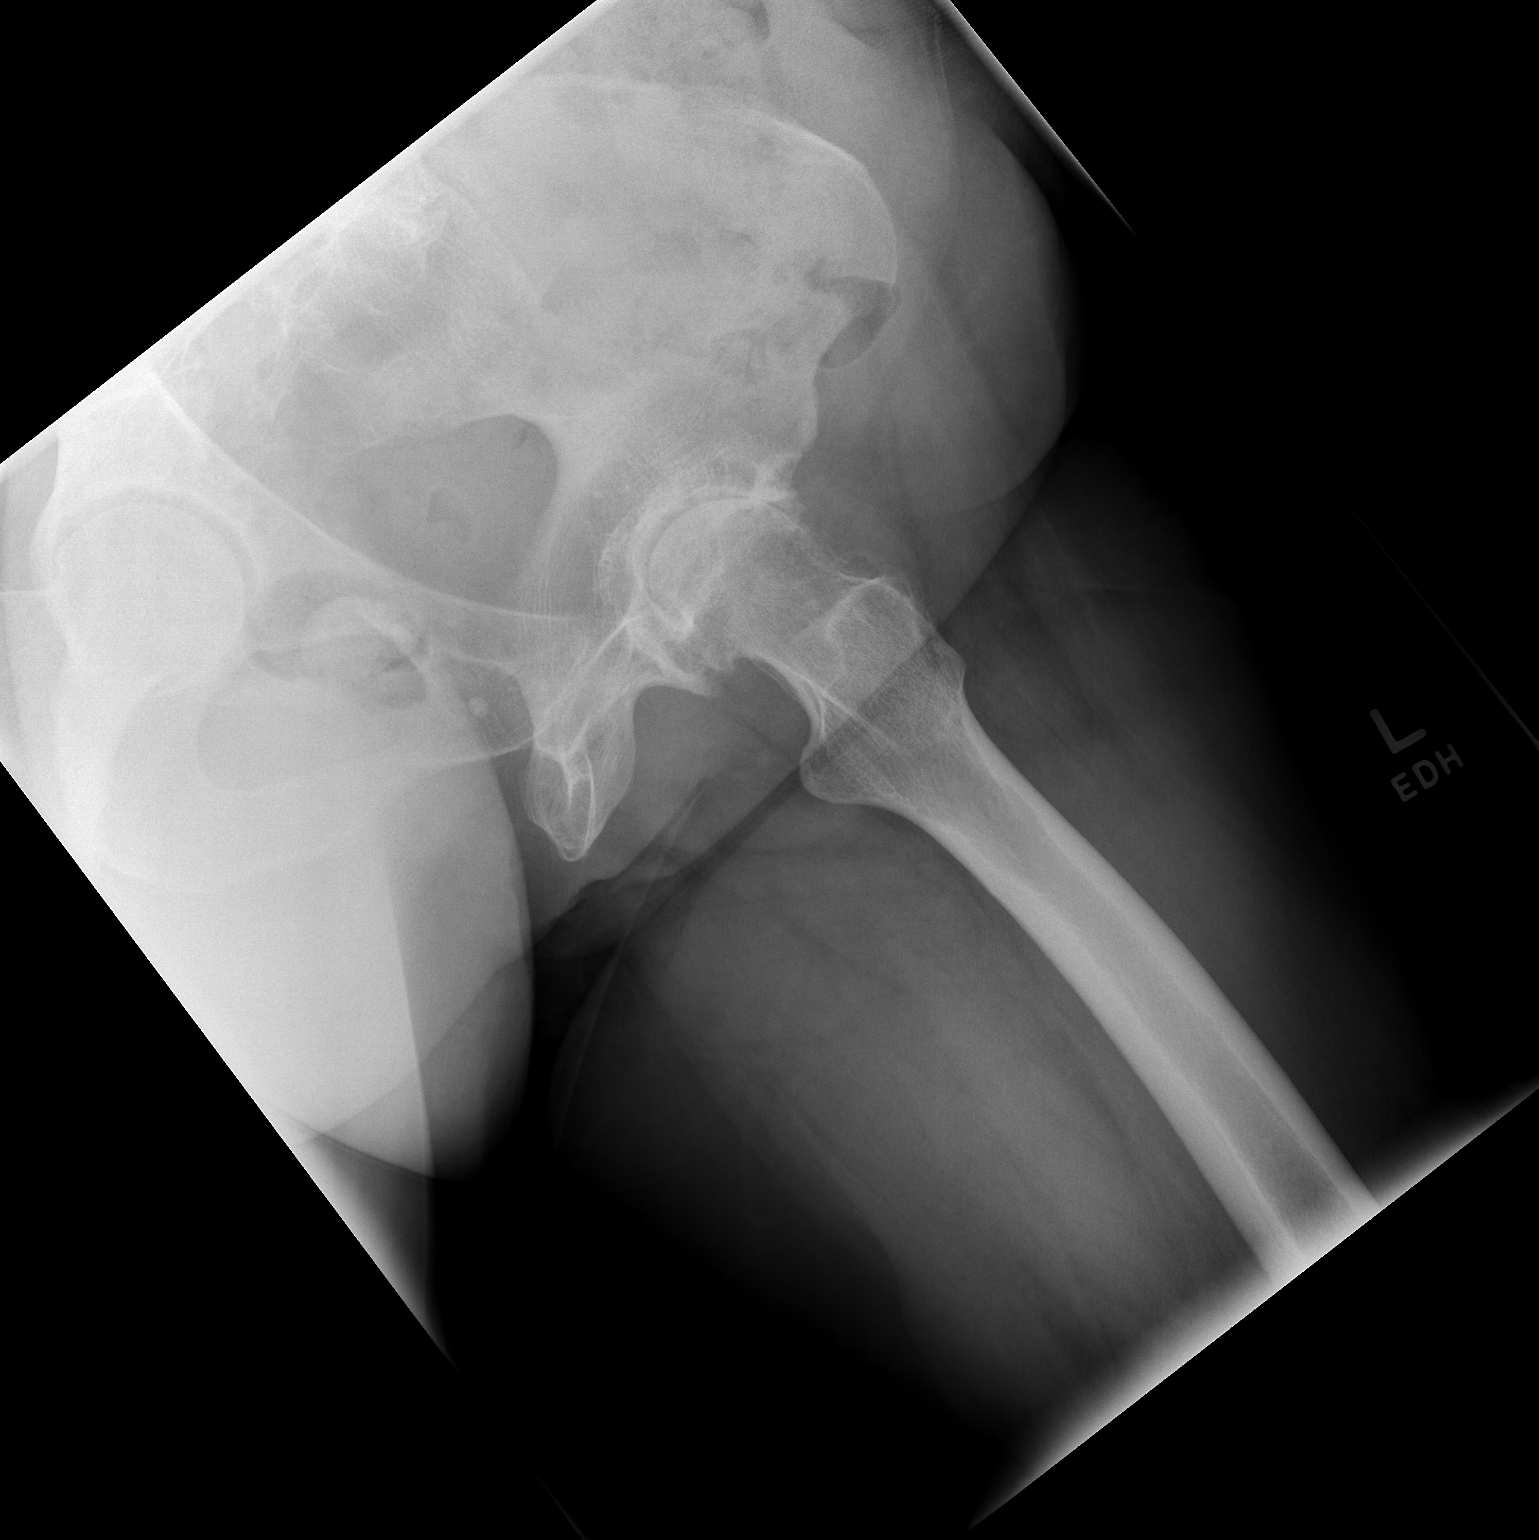

[2 of 2 positions shown; findings below may reference images not displayed]

FINDINGS: Severe degenerative changes left hip. Sclerotic changes about the
left femoral head most likely degenerative however avascular
necrosis cannot be excluded and MRI can be obtained to further
evaluate as needed. No acute bony abnormality. Pelvic calcifications
consistent phleboliths.
IMPRESSION: Severe degenerative changes left hip. Sclerotic changes about the
left femoral head most likely degenerative however avascular
necrosis cannot be excluded. MRI of the left hip can be obtained as
needed. No acute abnormality identified.

## 2020-09-16 DIAGNOSIS — Z789 Other specified health status: Secondary | ICD-10-CM | POA: Diagnosis not present

## 2020-09-16 DIAGNOSIS — R29898 Other symptoms and signs involving the musculoskeletal system: Secondary | ICD-10-CM | POA: Diagnosis not present

## 2020-09-16 DIAGNOSIS — G35 Multiple sclerosis: Secondary | ICD-10-CM | POA: Diagnosis not present

## 2020-09-16 DIAGNOSIS — G379 Demyelinating disease of central nervous system, unspecified: Secondary | ICD-10-CM | POA: Diagnosis not present

## 2020-09-16 DIAGNOSIS — Z96642 Presence of left artificial hip joint: Secondary | ICD-10-CM | POA: Diagnosis not present

## 2020-09-24 ENCOUNTER — Other Ambulatory Visit: Payer: Self-pay | Admitting: Neurology

## 2020-10-01 DIAGNOSIS — G379 Demyelinating disease of central nervous system, unspecified: Secondary | ICD-10-CM | POA: Diagnosis not present

## 2020-10-01 DIAGNOSIS — Z96642 Presence of left artificial hip joint: Secondary | ICD-10-CM | POA: Diagnosis not present

## 2020-10-01 DIAGNOSIS — R29898 Other symptoms and signs involving the musculoskeletal system: Secondary | ICD-10-CM | POA: Diagnosis not present

## 2020-10-01 DIAGNOSIS — Z789 Other specified health status: Secondary | ICD-10-CM | POA: Diagnosis not present

## 2020-10-01 DIAGNOSIS — G35 Multiple sclerosis: Secondary | ICD-10-CM | POA: Diagnosis not present

## 2020-10-15 DIAGNOSIS — Z789 Other specified health status: Secondary | ICD-10-CM | POA: Diagnosis not present

## 2020-10-15 DIAGNOSIS — R29898 Other symptoms and signs involving the musculoskeletal system: Secondary | ICD-10-CM | POA: Diagnosis not present

## 2020-10-15 DIAGNOSIS — G35 Multiple sclerosis: Secondary | ICD-10-CM | POA: Diagnosis not present

## 2020-10-15 DIAGNOSIS — G379 Demyelinating disease of central nervous system, unspecified: Secondary | ICD-10-CM | POA: Diagnosis not present

## 2020-10-15 DIAGNOSIS — Z96642 Presence of left artificial hip joint: Secondary | ICD-10-CM | POA: Diagnosis not present

## 2020-10-20 DIAGNOSIS — M546 Pain in thoracic spine: Secondary | ICD-10-CM | POA: Diagnosis not present

## 2020-10-20 DIAGNOSIS — M545 Low back pain, unspecified: Secondary | ICD-10-CM | POA: Diagnosis not present

## 2020-10-20 DIAGNOSIS — M5416 Radiculopathy, lumbar region: Secondary | ICD-10-CM | POA: Diagnosis not present

## 2020-10-20 DIAGNOSIS — G379 Demyelinating disease of central nervous system, unspecified: Secondary | ICD-10-CM | POA: Diagnosis not present

## 2020-10-22 DIAGNOSIS — Z96642 Presence of left artificial hip joint: Secondary | ICD-10-CM | POA: Diagnosis not present

## 2020-10-22 DIAGNOSIS — G35 Multiple sclerosis: Secondary | ICD-10-CM | POA: Diagnosis not present

## 2020-10-22 DIAGNOSIS — R29898 Other symptoms and signs involving the musculoskeletal system: Secondary | ICD-10-CM | POA: Diagnosis not present

## 2020-10-22 DIAGNOSIS — G379 Demyelinating disease of central nervous system, unspecified: Secondary | ICD-10-CM | POA: Diagnosis not present

## 2020-10-22 DIAGNOSIS — Z789 Other specified health status: Secondary | ICD-10-CM | POA: Diagnosis not present

## 2020-10-30 ENCOUNTER — Other Ambulatory Visit: Payer: Self-pay | Admitting: Neurology

## 2020-11-02 DIAGNOSIS — R29898 Other symptoms and signs involving the musculoskeletal system: Secondary | ICD-10-CM | POA: Diagnosis not present

## 2020-11-02 DIAGNOSIS — Z96642 Presence of left artificial hip joint: Secondary | ICD-10-CM | POA: Diagnosis not present

## 2020-11-02 DIAGNOSIS — G35 Multiple sclerosis: Secondary | ICD-10-CM | POA: Diagnosis not present

## 2020-11-06 DIAGNOSIS — G35 Multiple sclerosis: Secondary | ICD-10-CM | POA: Diagnosis not present

## 2020-11-06 DIAGNOSIS — R29898 Other symptoms and signs involving the musculoskeletal system: Secondary | ICD-10-CM | POA: Diagnosis not present

## 2020-11-06 DIAGNOSIS — Z96642 Presence of left artificial hip joint: Secondary | ICD-10-CM | POA: Diagnosis not present

## 2020-11-10 DIAGNOSIS — M4726 Other spondylosis with radiculopathy, lumbar region: Secondary | ICD-10-CM | POA: Diagnosis not present

## 2020-11-10 DIAGNOSIS — R29898 Other symptoms and signs involving the musculoskeletal system: Secondary | ICD-10-CM | POA: Diagnosis not present

## 2020-11-10 DIAGNOSIS — M5116 Intervertebral disc disorders with radiculopathy, lumbar region: Secondary | ICD-10-CM | POA: Diagnosis not present

## 2020-11-10 DIAGNOSIS — M4727 Other spondylosis with radiculopathy, lumbosacral region: Secondary | ICD-10-CM | POA: Diagnosis not present

## 2020-11-10 DIAGNOSIS — G35 Multiple sclerosis: Secondary | ICD-10-CM | POA: Diagnosis not present

## 2020-11-10 DIAGNOSIS — Z96642 Presence of left artificial hip joint: Secondary | ICD-10-CM | POA: Diagnosis not present

## 2020-11-10 DIAGNOSIS — M5416 Radiculopathy, lumbar region: Secondary | ICD-10-CM | POA: Diagnosis not present

## 2020-11-12 DIAGNOSIS — M546 Pain in thoracic spine: Secondary | ICD-10-CM | POA: Diagnosis not present

## 2020-11-12 DIAGNOSIS — G379 Demyelinating disease of central nervous system, unspecified: Secondary | ICD-10-CM | POA: Diagnosis not present

## 2020-11-12 DIAGNOSIS — M545 Low back pain, unspecified: Secondary | ICD-10-CM | POA: Diagnosis not present

## 2020-11-13 DIAGNOSIS — R29898 Other symptoms and signs involving the musculoskeletal system: Secondary | ICD-10-CM | POA: Diagnosis not present

## 2020-11-13 DIAGNOSIS — G35 Multiple sclerosis: Secondary | ICD-10-CM | POA: Diagnosis not present

## 2020-11-13 DIAGNOSIS — Z96642 Presence of left artificial hip joint: Secondary | ICD-10-CM | POA: Diagnosis not present

## 2020-11-20 DIAGNOSIS — R29898 Other symptoms and signs involving the musculoskeletal system: Secondary | ICD-10-CM | POA: Diagnosis not present

## 2020-11-20 DIAGNOSIS — G35 Multiple sclerosis: Secondary | ICD-10-CM | POA: Diagnosis not present

## 2020-11-20 DIAGNOSIS — Z96642 Presence of left artificial hip joint: Secondary | ICD-10-CM | POA: Diagnosis not present

## 2020-11-25 ENCOUNTER — Other Ambulatory Visit: Payer: Self-pay | Admitting: Physician Assistant

## 2020-12-19 ENCOUNTER — Other Ambulatory Visit: Payer: Self-pay | Admitting: Physician Assistant

## 2020-12-19 ENCOUNTER — Other Ambulatory Visit: Payer: Self-pay | Admitting: Neurology

## 2021-01-06 DIAGNOSIS — H00025 Hordeolum internum left lower eyelid: Secondary | ICD-10-CM | POA: Diagnosis not present

## 2021-02-01 ENCOUNTER — Ambulatory Visit: Payer: BC Managed Care – PPO | Admitting: Neurology

## 2021-05-28 ENCOUNTER — Other Ambulatory Visit: Payer: Self-pay | Admitting: Neurology

## 2021-07-06 ENCOUNTER — Other Ambulatory Visit: Payer: Self-pay | Admitting: Neurology

## 2022-01-16 ENCOUNTER — Telehealth: Payer: BC Managed Care – PPO | Admitting: Nurse Practitioner

## 2022-01-16 DIAGNOSIS — B3731 Acute candidiasis of vulva and vagina: Secondary | ICD-10-CM | POA: Diagnosis not present

## 2022-01-16 MED ORDER — FLUCONAZOLE 150 MG PO TABS: 150.0000 mg | ORAL_TABLET | Freq: Once | ORAL | 0 refills | Status: AC

## 2022-01-16 MED ORDER — FLUCONAZOLE 150 MG PO TABS: 150.0000 mg | ORAL_TABLET | Freq: Once | ORAL | 0 refills | Status: DC

## 2022-01-16 NOTE — Patient Instructions (Addendum)
?  Nani Skillern, thank you for joining Gildardo Pounds, NP for today's virtual visit.  While this provider is not your primary care provider (PCP), if your PCP is located in our provider database this encounter information will be shared with them immediately following your visit. ? ?Consent: ?(Patient) Regina Griffin provided verbal consent for this virtual visit at the beginning of the encounter. ? ?Current Medications: ? ?Current Outpatient Medications:  ?  baclofen (LIORESAL) 10 MG tablet, TAKE 1 TABLET BY MOUTH EVERYDAY AT BEDTIME NEED FOLLOW UP APPOINTMENT FOR ANY MORE REFILLS, Disp: 30 tablet, Rfl: 0 ?  buPROPion (WELLBUTRIN XL) 300 MG 24 hr tablet, TAKE 1 TABLET BY MOUTH EVERY DAY, Disp: 90 tablet, Rfl: 1 ?  Cholecalciferol (VITAMIN D3) 5000 UNITS CAPS, Take 5,000 Units by mouth daily., Disp: , Rfl:  ?  fluconazole (DIFLUCAN) 150 MG tablet, Take 1 tablet (150 mg total) by mouth once for 1 dose., Disp: 1 tablet, Rfl: 0 ?  gabapentin (NEURONTIN) 300 MG capsule, 1 CAPSULE TWICE DAILY, Disp: 180 capsule, Rfl: 3 ?  Multiple Vitamin (MULTIVITAMIN) tablet, Take 1 tablet by mouth daily., Disp: , Rfl:  ?  pantoprazole (PROTONIX) 40 MG tablet, TAKE 1 TABLET BY MOUTH EVERY DAY, Disp: 30 tablet, Rfl: 3  ? ?Medications ordered in this encounter:  ?Meds ordered this encounter  ?Medications  ? fluconazole (DIFLUCAN) 150 MG tablet  ?  Sig: Take 1 tablet (150 mg total) by mouth once for 1 dose.  ?  Dispense:  1 tablet  ?  Refill:  0  ?  Order Specific Question:   Supervising Provider  ?  Answer:   Noemi Chapel [[id number]]  ?  ? ?*If you need refills on other medications prior to your next appointment, please contact your pharmacy* ? ?Follow-Up: ?Call back or seek an in-person evaluation if the symptoms worsen or if the condition fails to improve as anticipated. ? ?AVOID vaginal perfumes, douches, wipe from front to back and wear breathable underwear to cut down on risk of vaginitis.  ? ? ?If you have been instructed to have  an in-person evaluation today at a local Urgent Care facility, please use the link below. It will take you to a list of all of our available [facility], including address, phone number and hours of operation. Please do not delay care.  ?[facility] ? ?If you or a family member do not have a primary care provider, use the link below to schedule a visit and establish care. When you choose a  primary care physician or advanced practice provider, you gain a long-term partner in health. ?Find a Primary Care Provider ? ?Learn more about 's in-office and virtual care options: ?[facility]  ?

## 2022-01-16 NOTE — Progress Notes (Signed)
?Virtual Visit Consent  ? ?Regina Griffin, you are scheduled for a virtual visit with a [facility] provider today.   ?  ?Just as with appointments in the office, your consent must be obtained to participate.  Your consent will be active for this visit and any virtual visit you may have with one of our providers in the next 365 days.   ?  ?If you have a MyChart account, a copy of this consent can be sent to you electronically.  All virtual visits are billed to your insurance company just like a traditional visit in the office.   ? ?As this is a virtual visit, video technology does not allow for your provider to perform a traditional examination.  This may limit your provider's ability to fully assess your condition.  If your provider identifies any concerns that need to be evaluated in person or the need to arrange testing (such as labs, EKG, etc.), we will make arrangements to do so.   ?  ?Although advances in technology are sophisticated, we cannot ensure that it will always work on either your end or our end.  If the connection with a video visit is poor, the visit may have to be switched to a telephone visit.  With either a video or telephone visit, we are not always able to ensure that we have a secure connection.    ? ?I need to obtain your verbal consent now.   Are you willing to proceed with your visit today?  ?  ?Regina Griffin has provided verbal consent on 01/16/2022 for a virtual visit (video or telephone). ?  ?Regina Griffin, Regina Griffin  ? ?Date: 01/16/2022 2:05 PM ? ? ?Virtual Visit via Video Note  ? ?[name], connected with  Jaquesha Bottger  ([id number], Dec 23, 1960) on 01/16/22 at  2:15 PM EDT by a video-enabled telemedicine application and verified that I am speaking with the correct person using two identifiers. ? ?Location: ?Patient: Virtual Visit Location Patient: Home ?Provider: Virtual Visit Location Provider: Home Office ?  ?I discussed the limitations of evaluation and management by  telemedicine and the availability of in person appointments. The patient expressed understanding and agreed to proceed.   ? ?History of Present Illness: ?Regina Griffin is a 61 y.o. who identifies as a female who was assigned female at birth, and is being seen today for vaginal yeast infection. ? ?Symptoms started several days ago and she currently notes vaginal itching, irritation and increased thick discharge. She has had yeast vaginitis in the past and currently symptoms  are the same as past infections.  ? ? ?Problems:  ?Patient Active Problem List  ? Diagnosis Date Noted  ? Tobacco use disorder 05/20/2019  ? Left leg pain 12/30/2018  ? Thyroid nodule 10/17/2016  ? Cervical high risk HPV (human papillomavirus) test positive 09/28/2016  ? Breast cancer screening 05/20/2016  ? Visit for preventive health examination 05/20/2016  ? Stopped smoking 09/02/2015  ? Multiple sclerosis (HCC) 07/23/2015  ? Transverse myelopathy (HCC) 07/23/2015  ? Neuropathy 06/28/2015  ? History of shingles 06/28/2015  ?  ?Allergies: No Known Allergies ?Medications:  ?Current Outpatient Medications:  ?  baclofen (LIORESAL) 10 MG tablet, TAKE 1 TABLET BY MOUTH EVERYDAY AT BEDTIME NEED FOLLOW UP APPOINTMENT FOR ANY MORE REFILLS, Disp: 30 tablet, Rfl: 0 ?  buPROPion (WELLBUTRIN XL) 300 MG 24 hr tablet, TAKE 1 TABLET BY MOUTH EVERY DAY, Disp: 90 tablet, Rfl: 1 ?  Cholecalciferol (VITAMIN D3) 5000 UNITS CAPS,  Take 5,000 Units by mouth daily., Disp: , Rfl:  ?  fluconazole (DIFLUCAN) 150 MG tablet, Take 1 tablet (150 mg total) by mouth once for 1 dose., Disp: 1 tablet, Rfl: 0 ?  gabapentin (NEURONTIN) 300 MG capsule, 1 CAPSULE TWICE DAILY, Disp: 180 capsule, Rfl: 3 ?  Multiple Vitamin (MULTIVITAMIN) tablet, Take 1 tablet by mouth daily., Disp: , Rfl:  ?  pantoprazole (PROTONIX) 40 MG tablet, TAKE 1 TABLET BY MOUTH EVERY DAY, Disp: 30 tablet, Rfl: 3 ? ?Observations/Objective: ?Patient is well-developed, well-nourished in no acute distress.   ?Resting comfortably at home.  ?Head is normocephalic, atraumatic.  ?No labored breathing.  ?Speech is clear and coherent with logical content.  ?Patient is alert and oriented at baseline.  ? ? ?Assessment and Plan: ?1. Yeast vaginitis ?- fluconazole (DIFLUCAN) 150 MG tablet; Take 1 tablet (150 mg total) by mouth once for 1 dose.  Dispense: 1 tablet; Refill: 0 ? ? ?Follow Up Instructions: ?I discussed the assessment and treatment plan with the patient. The patient was provided an opportunity to ask questions and all were answered. The patient agreed with the plan and demonstrated an understanding of the instructions.  A copy of instructions were sent to the patient via MyChart unless otherwise noted below.  ? ? ?The patient was advised to call back or seek an in-person evaluation if the symptoms worsen or if the condition fails to improve as anticipated. ? ?Time:  ?I spent 11 minutes with the patient via telehealth technology discussing the above problems/concerns.   ? ?Gildardo Pounds, Regina Griffin  ?

## 2023-09-20 ENCOUNTER — Encounter: Payer: Self-pay | Admitting: Gastroenterology
